# Patient Record
Sex: Female | Born: 1995 | Race: White | Hispanic: No | Smoking: Former smoker
Health system: Southern US, Community
[De-identification: ages and names within clinical notes are randomized; demographics above are authoritative.]

## PROBLEM LIST (undated history)

## (undated) ENCOUNTER — Inpatient Hospital Stay: Payer: Self-pay

## (undated) DIAGNOSIS — F32A Depression, unspecified: Secondary | ICD-10-CM

## (undated) DIAGNOSIS — F419 Anxiety disorder, unspecified: Secondary | ICD-10-CM

## (undated) DIAGNOSIS — N200 Calculus of kidney: Secondary | ICD-10-CM

## (undated) HISTORY — DX: Depression, unspecified: F32.A

## (undated) HISTORY — PX: NO PAST SURGERIES: SHX2092

## (undated) HISTORY — DX: Calculus of kidney: N20.0

## (undated) HISTORY — DX: Anxiety disorder, unspecified: F41.9

---

## 2013-03-29 ENCOUNTER — Emergency Department: Payer: Self-pay | Admitting: Emergency Medicine

## 2013-03-29 LAB — CBC
HCT: 45.8 % (ref 35.0–47.0)
HGB: 15.6 g/dL (ref 12.0–16.0)
MCH: 29.8 pg (ref 26.0–34.0)
MCHC: 34 g/dL (ref 32.0–36.0)
MCV: 88 fL (ref 80–100)
RBC: 5.21 10*6/uL — ABNORMAL HIGH (ref 3.80–5.20)

## 2013-03-29 LAB — COMPREHENSIVE METABOLIC PANEL
Albumin: 4.5 g/dL (ref 3.8–5.6)
Alkaline Phosphatase: 81 U/L — ABNORMAL LOW (ref 82–169)
Anion Gap: 7 (ref 7–16)
BUN: 12 mg/dL (ref 7–18)
Calcium, Total: 9.6 mg/dL (ref 9.0–10.7)
Co2: 26 mmol/L (ref 21–32)
Creatinine: 1.15 mg/dL (ref 0.60–1.30)
Glucose: 106 mg/dL — ABNORMAL HIGH (ref 65–99)
Osmolality: 278 (ref 275–301)
SGPT (ALT): 22 U/L (ref 12–78)
Sodium: 139 mmol/L (ref 136–145)

## 2013-03-29 LAB — URINALYSIS, COMPLETE
Bilirubin,UR: NEGATIVE
Glucose,UR: NEGATIVE mg/dL (ref 0–75)
Nitrite: NEGATIVE
Ph: 6 (ref 4.5–8.0)
Protein: 30
RBC,UR: 38 /HPF (ref 0–5)

## 2014-05-18 IMAGING — CT CT ABD-PELV W/O CM
1 of 2 series · 15 of 32 positions shown, 19 images · non-contrast
Comparison: none

REASON FOR EXAM: (1) flank pain, hematuria; (2) flank pain, hematuria
COMMENTS:

[Series 2: soft tissue · axial · 0.74mm/px · z∈[-1012,-559]mm · 15 of 165 slices shown, 19 images]
[im 7/165  soft-tissue]
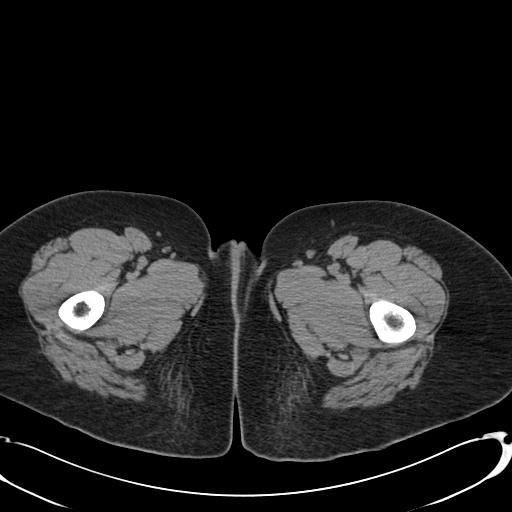
[im 7/165  bone]
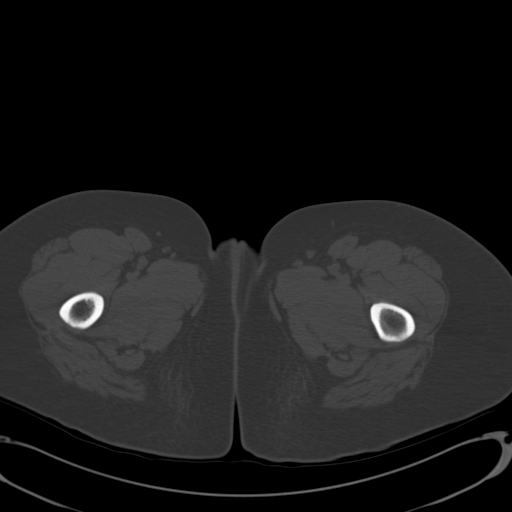
[im 20/165  soft-tissue]
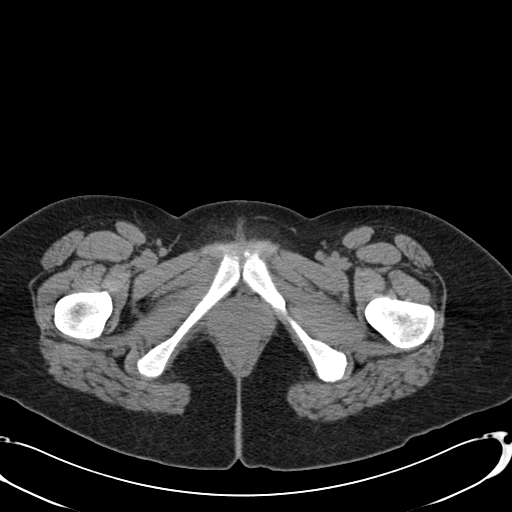
[im 33/165  soft-tissue]
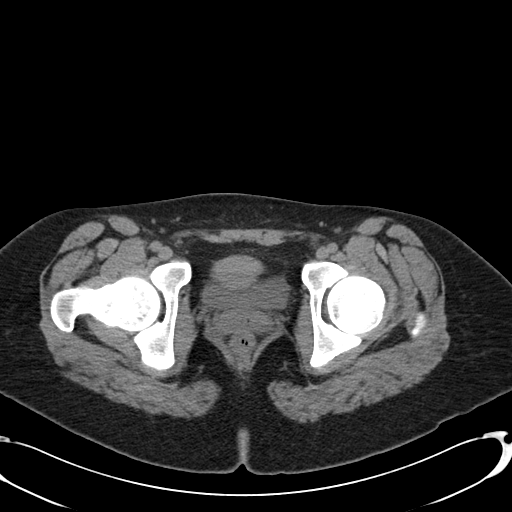
[im 46/165  soft-tissue]
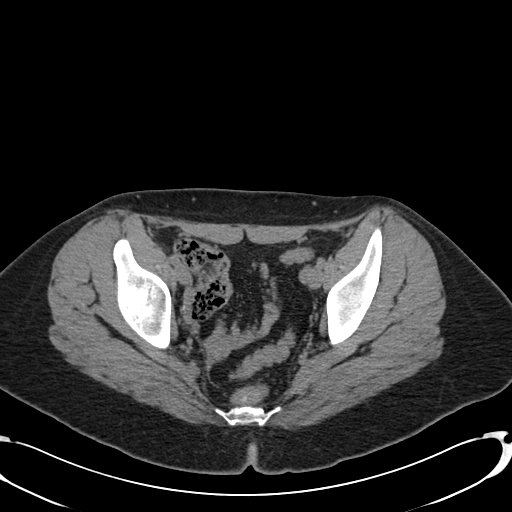
[im 60/165  soft-tissue]
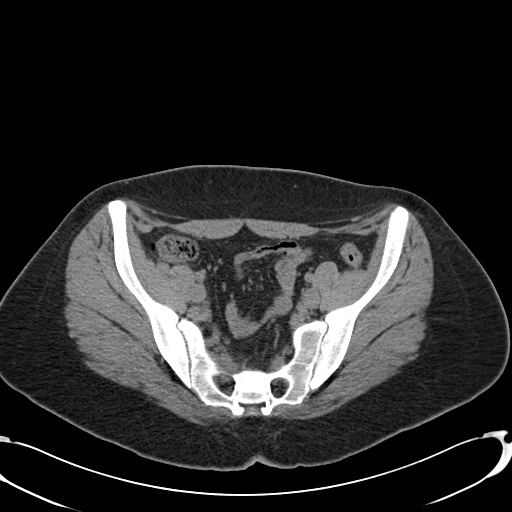
[im 73/165  soft-tissue]
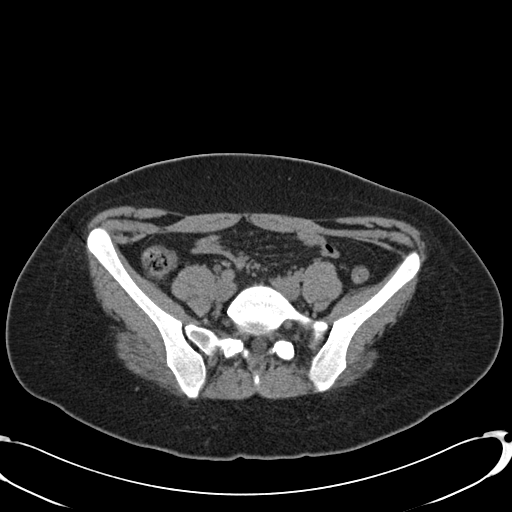
[im 86/165  soft-tissue]
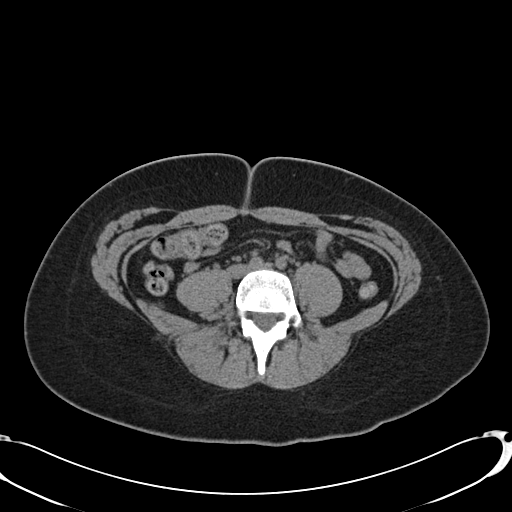
[im 92/165  soft-tissue]
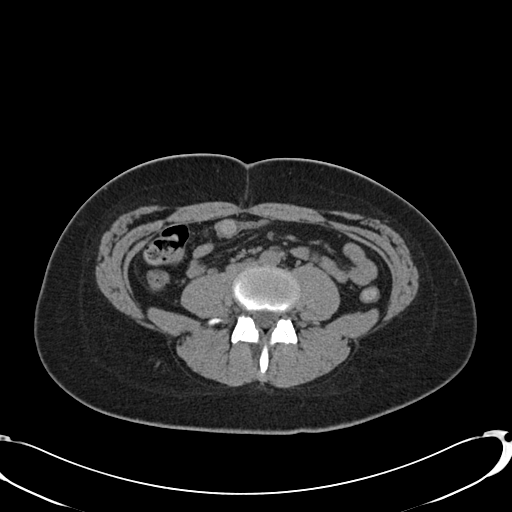
[im 105/165  soft-tissue]
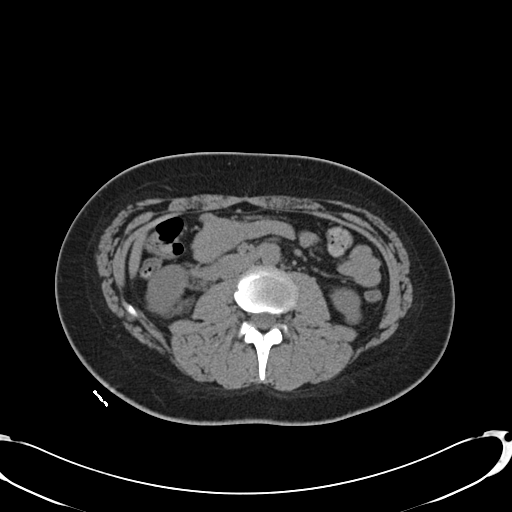
[im 105/165  bone]
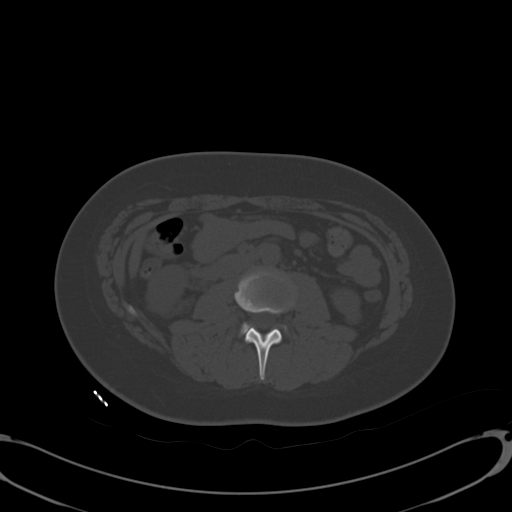
[im 119/165  soft-tissue]
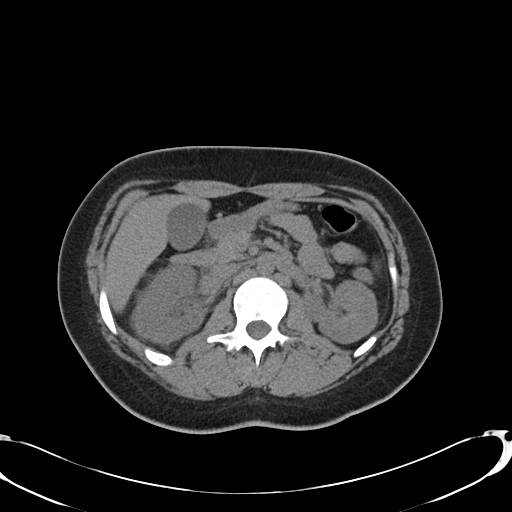
[im 132/165  soft-tissue]
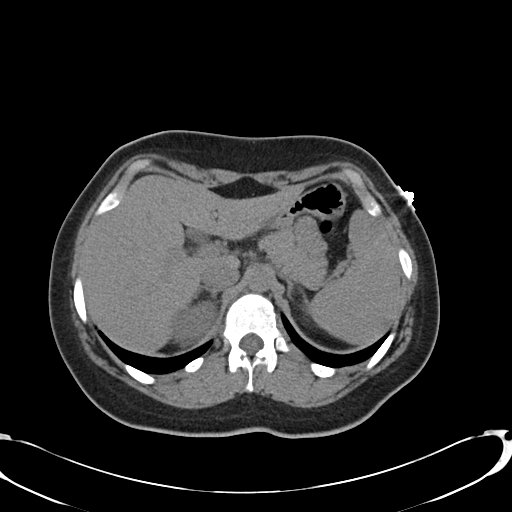
[im 138/165  lung]
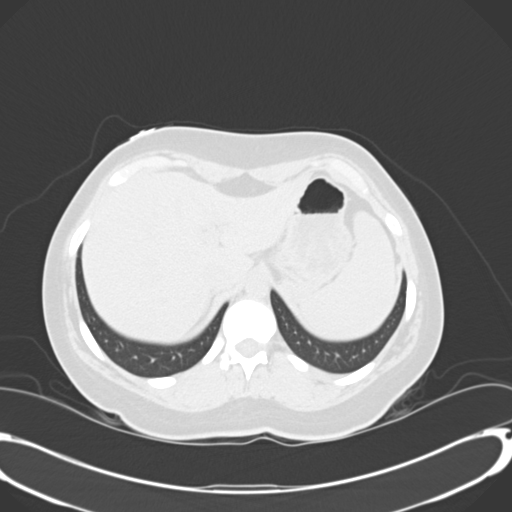
[im 145/165  soft-tissue]
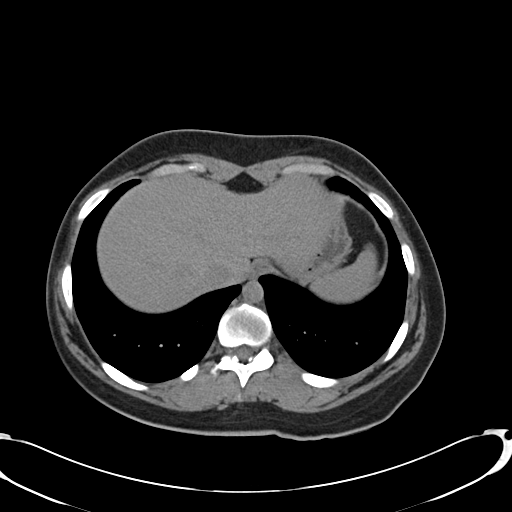
[im 145/165  lung]
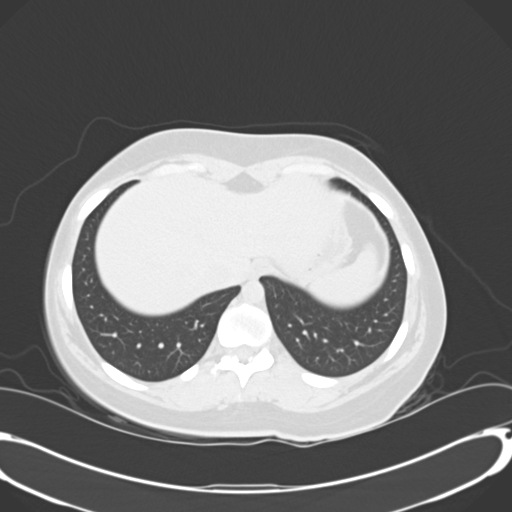
[im 151/165  lung]
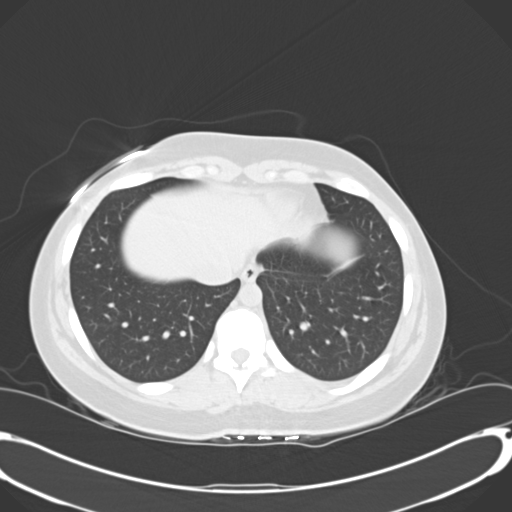
[im 158/165  soft-tissue]
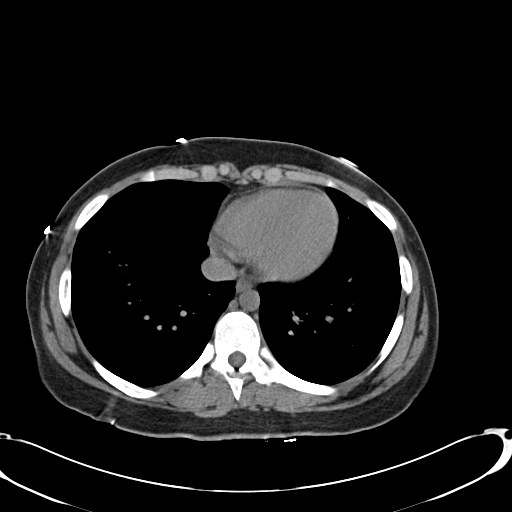
[im 158/165  lung]
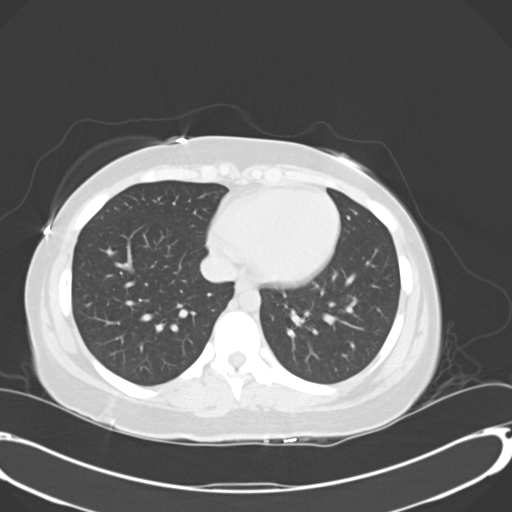

[15 of 32 positions shown; findings below may reference images not displayed]

PROCEDURE:     CT  - CT ABDOMEN AND PELVIS W[DATE]  [DATE]

RESULT:     Axial noncontrast CT scanning was performed through the abdomen
and pelvis with reconstructions at 3 mm intervals and slice thicknesses.
Review of multiplanar reconstructed images was performed separately on the
siemens be a workstation.

There is very mild hydronephrosis and hydroureter on the right which appears
to be related to an approximately 1 mm diameter stone on image 128 near the
ureterovesical junction. A 1 mm diameter calcification on image 131 is felt
to reflect a phlebolith. I do not see evidence of other discrete stones on
the right but there is cloudlike calcification within the medullary portion
of the right upper pole. Mildly increased perinephric fat density is
present. On the left cloudlike density is present in the medullary regions.
There no discrete stones nor is there evidence of obstruction.

The liver, gallbladder, pancreas, spleen, partially distended stomach,
adrenal glands, and abdominal aorta are normal in appearance. The
unopacified loops of small and large bowel exhibit no evidence of ileus nor
of obstruction. A normal calibered gas-filled uninflamed appendix is
demonstrated on images 107 through 113. The uterus and adnexal structures
appear normal for age. There is no significant pelvic fluid collection.
There is no inguinal nor umbilical hernia.

The lung bases are clear. The lumbar vertebral bodies are preserved in
height.
IMPRESSION: 1. There is mild hydronephrosis and hydroureter on the right secondary to an
approximately 1 mm diameter distal right ureteral stone seen on image 128. I
do not see evidence of discrete stones elsewhere on the right or left. There
is no obstruction of the left renal collecting system.
2. There is no acute hepatobiliary, gynecological, or bowel abnormality.

[REDACTED]

## 2017-10-02 NOTE — L&D Delivery Note (Signed)
       Delivery Note   Taylor Galvan is a 22 y.o. G1P0000 at [redacted]w[redacted]d Estimated Date of Delivery: 07/18/18  PRE-OPERATIVE DIAGNOSIS:  1) [redacted]w[redacted]d pregnancy.  2) PIH  POST-OPERATIVE DIAGNOSIS:  1) [redacted]w[redacted]d pregnancy s/p Vaginal, Vacuum (Extractor)  2) asynclitic vertex presentation 3) moderate shoulder dystocia   Delivery Type: Vaginal, Vacuum (Extractor)    Delivery Anesthesia: Epidural   Labor Complications:       ESTIMATED BLOOD LOSS: 75  ml    FINDINGS:   1) female infant, Apgar scores of     at 1 minute and     at 5 minutes and a birthweight of    ounces.    2) Nuchal cord: No  SPECIMENS:   PLACENTA:   Appearance: Intact    Removal: Spontaneous      Disposition:     DISPOSITION:  Infant to transported to Neonatal ICU  NARRATIVE SUMMARY: Labor course:  Taylor Galvan is a G1P0000 at [redacted]w[redacted]d who presented for induction of labor.  She received 2 doses of vaginal Misoprostol, had AROM and pitocin augmentation of labor.  She progressed well in labor.  She received the appropriate anesthesia and proceeded to complete dilation. She evidenced good maternal expulsive effort during the second stage but had slow progress.  A vacuum delivery was indicated.  There was adequate room in the pelvis and the fetal vertex was on the perineum.  The vacuum was applied and traction was performed with each contraction.  Descent of the vertex was noted with each pull. An episiotomy was indicated and after local anes performed in the midline. The vertex delivered from and asynclitic presentation coincident with the R arm posteriorly. Pt was place in McRoberts position and the posterior arm was swept up and delivered allowing delivery of the anterior shoulder.  Baby was placed under the warmer and received resuscitation from the neonatal staff. The placenta delivered without problems and was noted to be complete. A perineal and vaginal examination was performed. Episiotomy or lacerations were  repaired with Vicryl suture using local anesthesia. The patient tolerated this well.  Elonda Husky, M.D. 07/12/2018 2:05 AM

## 2017-11-27 ENCOUNTER — Encounter: Payer: Self-pay | Admitting: Obstetrics and Gynecology

## 2017-11-27 ENCOUNTER — Ambulatory Visit: Payer: 59 | Admitting: Obstetrics and Gynecology

## 2017-11-27 VITALS — BP 137/76 | HR 79 | Wt 158.6 lb

## 2017-11-27 DIAGNOSIS — Z3201 Encounter for pregnancy test, result positive: Secondary | ICD-10-CM

## 2017-11-27 DIAGNOSIS — R110 Nausea: Secondary | ICD-10-CM

## 2017-11-27 DIAGNOSIS — N912 Amenorrhea, unspecified: Secondary | ICD-10-CM

## 2017-11-27 LAB — POCT URINE PREGNANCY: Preg Test, Ur: POSITIVE — AB

## 2017-11-27 NOTE — Progress Notes (Signed)
Pt had a positive pregnancy test.

## 2017-11-27 NOTE — Patient Instructions (Addendum)
First Trimester of Pregnancy The first trimester of pregnancy is from week 1 until the end of week 13 (months 1 through 3). A week after a sperm fertilizes an egg, the egg will implant on the wall of the uterus. This embryo will begin to develop into a baby. Genes from you and your partner will form the baby. The female genes will determine whether the baby will be a boy or a girl. At 6-8 weeks, the eyes and face will be formed, and the heartbeat can be seen on ultrasound. At the end of 12 weeks, all the baby's organs will be formed. Now that you are pregnant, you will want to do everything you can to have a healthy baby. Two of the most important things are to get good prenatal care and to follow your health care provider's instructions. Prenatal care is all the medical care you receive before the baby's birth. This care will help prevent, find, and treat any problems during the pregnancy and childbirth. Body changes during your first trimester Your body goes through many changes during pregnancy. The changes vary from woman to woman.  You may gain or lose a couple of pounds at first.  You may feel sick to your stomach (nauseous) and you may throw up (vomit). If the vomiting is uncontrollable, call your health care provider.  You may tire easily.  You may develop headaches that can be relieved by medicines. All medicines should be approved by your health care provider.  You may urinate more often. Painful urination may mean you have a bladder infection.  You may develop heartburn as a result of your pregnancy.  You may develop constipation because certain hormones are causing the muscles that push stool through your intestines to slow down.  You may develop hemorrhoids or swollen veins (varicose veins).  Your breasts may begin to grow larger and become tender. Your nipples may stick out more, and the tissue that surrounds them (areola) may become darker.  Your gums may bleed and may be  sensitive to brushing and flossing.  Dark spots or blotches (chloasma, mask of pregnancy) may develop on your face. This will likely fade after the baby is born.  Your menstrual periods will stop.  You may have a loss of appetite.  You may develop cravings for certain kinds of food.  You may have changes in your emotions from day to day, such as being excited to be pregnant or being concerned that something may go wrong with the pregnancy and baby.  You may have more vivid and strange dreams.  You may have changes in your hair. These can include thickening of your hair, rapid growth, and changes in texture. Some women also have hair loss during or after pregnancy, or hair that feels dry or thin. Your hair will most likely return to normal after your baby is born.  What to expect at prenatal visits During a routine prenatal visit:  You will be weighed to make sure you and the baby are growing normally.  Your blood pressure will be taken.  Your abdomen will be measured to track your baby's growth.  The fetal heartbeat will be listened to between weeks 10 and 14 of your pregnancy.  Test results from any previous visits will be discussed.  Your health care provider may ask you:  How you are feeling.  If you are feeling the baby move.  If you have had any abnormal symptoms, such as leaking fluid, bleeding, severe headaches,   or abdominal cramping.  If you are using any tobacco products, including cigarettes, chewing tobacco, and electronic cigarettes.  If you have any questions.  Other tests that may be performed during your first trimester include:  Blood tests to find your blood type and to check for the presence of any previous infections. The tests will also be used to check for low iron levels (anemia) and protein on red blood cells (Rh antibodies). Depending on your risk factors, or if you previously had diabetes during pregnancy, you may have tests to check for high blood  sugar that affects pregnant women (gestational diabetes).  Urine tests to check for infections, diabetes, or protein in the urine.  An ultrasound to confirm the proper growth and development of the baby.  Fetal screens for spinal cord problems (spina bifida) and Down syndrome.  HIV (human immunodeficiency virus) testing. Routine prenatal testing includes screening for HIV, unless you choose not to have this test.  You may need other tests to make sure you and the baby are doing well.  Follow these instructions at home: Medicines  Follow your health care provider's instructions regarding medicine use. Specific medicines may be either safe or unsafe to take during pregnancy.  Take a prenatal vitamin that contains at least 600 micrograms (mcg) of folic acid.  If you develop constipation, try taking a stool softener if your health care provider approves. Eating and drinking  Eat a balanced diet that includes fresh fruits and vegetables, whole grains, good sources of protein such as meat, eggs, or tofu, and low-fat dairy. Your health care provider will help you determine the amount of weight gain that is right for you.  Avoid raw meat and uncooked cheese. These carry germs that can cause birth defects in the baby.  Eating four or five small meals rather than three large meals a day may help relieve nausea and vomiting. If you start to feel nauseous, eating a few soda crackers can be helpful. Drinking liquids between meals, instead of during meals, also seems to help ease nausea and vomiting.  Limit foods that are high in fat and processed sugars, such as fried and sweet foods.  To prevent constipation: ? Eat foods that are high in fiber, such as fresh fruits and vegetables, whole grains, and beans. ? Drink enough fluid to keep your urine clear or pale yellow. Activity  Exercise only as directed by your health care provider. Most women can continue their usual exercise routine during  pregnancy. Try to exercise for 30 minutes at least 5 days a week. Exercising will help you: ? Control your weight. ? Stay in shape. ? Be prepared for labor and delivery.  Experiencing pain or cramping in the lower abdomen or lower back is a good sign that you should stop exercising. Check with your health care provider before continuing with normal exercises.  Try to avoid standing for long periods of time. Move your legs often if you must stand in one place for a long time.  Avoid heavy lifting.  Wear low-heeled shoes and practice good posture.  You may continue to have sex unless your health care provider tells you not to. Relieving pain and discomfort  Wear a good support bra to relieve breast tenderness.  Take warm sitz baths to soothe any pain or discomfort caused by hemorrhoids. Use hemorrhoid cream if your health care provider approves.  Rest with your legs elevated if you have leg cramps or low back pain.  If you develop   varicose veins in your legs, wear support hose. Elevate your feet for 15 minutes, 3-4 times a day. Limit salt in your diet. Prenatal care  Schedule your prenatal visits by the twelfth week of pregnancy. They are usually scheduled monthly at first, then more often in the last 2 months before delivery.  Write down your questions. Take them to your prenatal visits.  Keep all your prenatal visits as told by your health care provider. This is important. Safety  Wear your seat belt at all times when driving.  Make a list of emergency phone numbers, including numbers for family, friends, the hospital, and police and fire departments. General instructions  Ask your health care provider for a referral to a local prenatal education class. Begin classes no later than the beginning of month 6 of your pregnancy.  Ask for help if you have counseling or nutritional needs during pregnancy. Your health care provider can offer advice or refer you to specialists for help  with various needs.  Do not use hot tubs, steam rooms, or saunas.  Do not douche or use tampons or scented sanitary pads.  Do not cross your legs for long periods of time.  Avoid cat litter boxes and soil used by cats. These carry germs that can cause birth defects in the baby and possibly loss of the fetus by miscarriage or stillbirth.  Avoid all smoking, herbs, alcohol, and medicines not prescribed by your health care provider. Chemicals in these products affect the formation and growth of the baby.  Do not use any products that contain nicotine or tobacco, such as cigarettes and e-cigarettes. If you need help quitting, ask your health care provider. You may receive counseling support and other resources to help you quit.  Schedule a dentist appointment. At home, brush your teeth with a soft toothbrush and be gentle when you floss. Contact a health care provider if:  You have dizziness.  You have mild pelvic cramps, pelvic pressure, or nagging pain in the abdominal area.  You have persistent nausea, vomiting, or diarrhea.  You have a bad smelling vaginal discharge.  You have pain when you urinate.  You notice increased swelling in your face, hands, legs, or ankles.  You are exposed to fifth disease or chickenpox.  You are exposed to German measles (rubella) and have never had it. Get help right away if:  You have a fever.  You are leaking fluid from your vagina.  You have spotting or bleeding from your vagina.  You have severe abdominal cramping or pain.  You have rapid weight gain or loss.  You vomit blood or material that looks like coffee grounds.  You develop a severe headache.  You have shortness of breath.  You have any kind of trauma, such as from a fall or a car accident. Summary  The first trimester of pregnancy is from week 1 until the end of week 13 (months 1 through 3).  Your body goes through many changes during pregnancy. The changes vary from  woman to woman.  You will have routine prenatal visits. During those visits, your health care provider will examine you, discuss any test results you may have, and talk with you about how you are feeling. This information is not intended to replace advice given to you by your health care provider. Make sure you discuss any questions you have with your health care provider. Document Released: 09/12/2001 Document Revised: 08/30/2016 Document Reviewed: 08/30/2016 Elsevier Interactive Patient Education  2018 [employer]   Inc[employer]  

## 2017-11-27 NOTE — Progress Notes (Signed)
   GYNECOLOGY CLINIC PROGRESS NOTE  Subjective:    Taylor Galvan is a 22 y.o. G0P0000 female who presents for evaluation of amenorrhea. She believes she could be pregnant. Pregnancy is desired. Pregnancy was planned. Sexual Activity: single partner, contraception: none.  Was previously on the pill until she got married in October. Current symptoms also include: fatigue and nausea. Last period was normal.  Patient's last menstrual period was 10/11/2017.    The following portions of the patient's history were reviewed and updated as appropriate:  She  has a past medical history of Kidney stone.   She  has a past surgical history that includes No past surgeries.   Her family history includes Hypertension in her maternal grandmother and mother; Kidney Stones in her maternal grandfather and mother.   She  reports that she has quit smoking. She has quit using smokeless tobacco. She reports that she does not drink alcohol or use drugs.   She has a current medication list which includes the following prescription(s): prenatal gummies/dha & fa.   She has No Known Allergies.    Review of Systems Pertinent items noted in HPI and remainder of comprehensive ROS otherwise negative.     Objective:    BP 137/76   Pulse 79   Wt 158 lb 9.6 oz (71.9 kg)   LMP 10/11/2017  General: alert, no distress and no acute distress    Lab Review Urine HCG: positive    Assessment:    Absence of menstruation.    Pregnancy test positive Nausea of pregnancy  Plan:   - Pregnancy Test: Positive: EDC: 07/18/2018, with EGA 6.5 weeks. Briefly discussed pre-natal care options. Pregnancy, Childbirth and the Newborn book given. Encouraged well-balanced diet, plenty of rest when needed, pre-natal vitamins daily and walking for exercise. Discussed self-help for nausea, avoiding OTC medications until consulting provider or pharmacist, other than Tylenol as needed, minimal caffeine (1-2 cups daily) and avoiding  alcohol. She will schedule her initial OB visit in the next month with her PCP or OB provider. Feel free to call with any questions.    - Given samples for Bonjesta for nausea, can send in prescription if desired.  - Will need ultrasound for dating at 12 weeks as she has normal cycles, no h/o bleeding.  Can also do genetic testing with 1st trimester screen at that time if desired.    Hildred Laserherry, Jaxxon Naeem, MD [facility]

## 2017-12-07 ENCOUNTER — Encounter: Payer: Self-pay | Admitting: Obstetrics and Gynecology

## 2017-12-10 ENCOUNTER — Ambulatory Visit (INDEPENDENT_AMBULATORY_CARE_PROVIDER_SITE_OTHER): Payer: 59 | Admitting: Obstetrics and Gynecology

## 2017-12-10 ENCOUNTER — Other Ambulatory Visit (INDEPENDENT_AMBULATORY_CARE_PROVIDER_SITE_OTHER): Payer: 59

## 2017-12-10 VITALS — BP 145/84 | HR 74 | Ht 68.0 in | Wt 161.9 lb

## 2017-12-10 DIAGNOSIS — Z3A08 8 weeks gestation of pregnancy: Secondary | ICD-10-CM

## 2017-12-10 DIAGNOSIS — Z3401 Encounter for supervision of normal first pregnancy, first trimester: Secondary | ICD-10-CM

## 2017-12-10 DIAGNOSIS — Z3201 Encounter for pregnancy test, result positive: Secondary | ICD-10-CM

## 2017-12-10 NOTE — Progress Notes (Signed)
Taylor Galvan presents for NOB nurse interview visit. Pregnancy confirmation done here at [facility].  G-1 .  P-    . Pregnancy education material explained and given. _No cats in the home. NOB labs ordered.  [name] Kitchen. HIV labs and Drug screen were explained optional and she did not decline. Drug screen ordered PNV encouraged. Genetic screening options discussed. Genetic testing:will discuss with the provider.  Pt may discuss with provider. All questions answered pt will follow up with Dr. Valentino Saxonherry January 01, 2018

## 2017-12-10 NOTE — Patient Instructions (Signed)
First Trimester of Pregnancy The first trimester of pregnancy is from week 1 until the end of week 13 (months 1 through 3). During this time, your baby will begin to develop inside you. At 6-8 weeks, the eyes and face are formed, and the heartbeat can be seen on ultrasound. At the end of 12 weeks, all the baby's organs are formed. Prenatal care is all the medical care you receive before the birth of your baby. Make sure you get good prenatal care and follow all of your doctor's instructions. Follow these instructions at home: Medicines  Take over-the-counter and prescription medicines only as told by your doctor. Some medicines are safe and some medicines are not safe during pregnancy.  Take a prenatal vitamin that contains at least 600 micrograms (mcg) of folic acid.  If you have trouble pooping (constipation), take medicine that will make your stool soft (stool softener) if your doctor approves. Eating and drinking  Eat regular, healthy meals.  Your doctor will tell you the amount of weight gain that is right for you.  Avoid raw meat and uncooked cheese.  If you feel sick to your stomach (nauseous) or throw up (vomit): ? Eat 4 or 5 small meals a day instead of 3 large meals. ? Try eating a few soda crackers. ? Drink liquids between meals instead of during meals.  To prevent constipation: ? Eat foods that are high in fiber, like fresh fruits and vegetables, whole grains, and beans. ? Drink enough fluids to keep your pee (urine) clear or pale yellow. Activity  Exercise only as told by your doctor. Stop exercising if you have cramps or pain in your lower belly (abdomen) or low back.  Do not exercise if it is too hot, too humid, or if you are in a place of great height (high altitude).  Try to avoid standing for long periods of time. Move your legs often if you must stand in one place for a long time.  Avoid heavy lifting.  Wear low-heeled shoes. Sit and stand up straight.  You  can have sex unless your doctor tells you not to. Relieving pain and discomfort  Wear a good support bra if your breasts are sore.  Take warm water baths (sitz baths) to soothe pain or discomfort caused by hemorrhoids. Use hemorrhoid cream if your doctor says it is okay.  Rest with your legs raised if you have leg cramps or low back pain.  If you have puffy, bulging veins (varicose veins) in your legs: ? Wear support hose or compression stockings as told by your doctor. ? Raise (elevate) your feet for 15 minutes, 3-4 times a day. ? Limit salt in your food. Prenatal care  Schedule your prenatal visits by the twelfth week of pregnancy.  Write down your questions. Take them to your prenatal visits.  Keep all your prenatal visits as told by your doctor. This is important. Safety  Wear your seat belt at all times when driving.  Make a list of emergency phone numbers. The list should include numbers for family, friends, the hospital, and police and fire departments. General instructions  Ask your doctor for a referral to a local prenatal class. Begin classes no later than at the start of month 6 of your pregnancy.  Ask for help if you need counseling or if you need help with nutrition. Your doctor can give you advice or tell you where to go for help.  Do not use hot tubs, steam rooms, or   saunas.  Do not douche or use tampons or scented sanitary pads.  Do not cross your legs for long periods of time.  Avoid all herbs and alcohol. Avoid drugs that are not approved by your doctor.  Do not use any tobacco products, including cigarettes, chewing tobacco, and electronic cigarettes. If you need help quitting, ask your doctor. You may get counseling or other support to help you quit.  Avoid cat litter boxes and soil used by cats. These carry germs that can cause birth defects in the baby and can cause a loss of your baby (miscarriage) or stillbirth.  Visit your dentist. At home, brush  your teeth with a soft toothbrush. Be gentle when you floss. Contact a doctor if:  You are dizzy.  You have mild cramps or pressure in your lower belly.  You have a nagging pain in your belly area.  You continue to feel sick to your stomach, you throw up, or you have watery poop (diarrhea).  You have a bad smelling fluid coming from your vagina.  You have pain when you pee (urinate).  You have increased puffiness (swelling) in your face, hands, legs, or ankles. Get help right away if:  You have a fever.  You are leaking fluid from your vagina.  You have spotting or bleeding from your vagina.  You have very bad belly cramping or pain.  You gain or lose weight rapidly.  You throw up blood. It may look like coffee grounds.  You are around people who have German measles, fifth disease, or chickenpox.  You have a very bad headache.  You have shortness of breath.  You have any kind of trauma, such as from a fall or a car accident. Summary  The first trimester of pregnancy is from week 1 until the end of week 13 (months 1 through 3).  To take care of yourself and your unborn baby, you will need to eat healthy meals, take medicines only if your doctor tells you to do so, and do activities that are safe for you and your baby.  Keep all follow-up visits as told by your doctor. This is important as your doctor will have to ensure that your baby is healthy and growing well. This information is not intended to replace advice given to you by your health care provider. Make sure you discuss any questions you have with your health care provider. Document Released: 03/06/2008 Document Revised: 09/26/2016 Document Reviewed: 09/26/2016 Elsevier Interactive Patient Education  2017 [employer]  

## 2017-12-11 LAB — CBC WITH DIFFERENTIAL/PLATELET
BASOS ABS: 0 10*3/uL (ref 0.0–0.2)
Basos: 0 %
EOS (ABSOLUTE): 0.1 10*3/uL (ref 0.0–0.4)
Eos: 1 %
Hematocrit: 41.3 % (ref 34.0–46.6)
Hemoglobin: 14.2 g/dL (ref 11.1–15.9)
Immature Grans (Abs): 0 10*3/uL (ref 0.0–0.1)
Immature Granulocytes: 0 %
Lymphocytes Absolute: 1.6 10*3/uL (ref 0.7–3.1)
Lymphs: 19 %
MCH: 30.3 pg (ref 26.6–33.0)
MCHC: 34.4 g/dL (ref 31.5–35.7)
MCV: 88 fL (ref 79–97)
MONOCYTES: 8 %
Monocytes Absolute: 0.7 10*3/uL (ref 0.1–0.9)
NEUTROS PCT: 72 %
Neutrophils Absolute: 6.2 10*3/uL (ref 1.4–7.0)
PLATELETS: 217 10*3/uL (ref 150–379)
RBC: 4.69 x10E6/uL (ref 3.77–5.28)
RDW: 13.1 % (ref 12.3–15.4)
WBC: 8.6 10*3/uL (ref 3.4–10.8)

## 2017-12-11 LAB — URINALYSIS, ROUTINE W REFLEX MICROSCOPIC
Bilirubin, UA: NEGATIVE
GLUCOSE, UA: NEGATIVE
KETONES UA: NEGATIVE
Leukocytes, UA: NEGATIVE
Nitrite, UA: NEGATIVE
RBC, UA: NEGATIVE
SPEC GRAV UA: 1.028 (ref 1.005–1.030)
UUROB: 0.2 mg/dL (ref 0.2–1.0)
pH, UA: 8 — ABNORMAL HIGH (ref 5.0–7.5)

## 2017-12-11 LAB — ANTIBODY SCREEN: Antibody Screen: NEGATIVE

## 2017-12-11 LAB — ABO AND RH: RH TYPE: NEGATIVE

## 2017-12-11 LAB — HEPATITIS B SURFACE ANTIGEN: HEP B S AG: NEGATIVE

## 2017-12-11 LAB — HIV ANTIBODY (ROUTINE TESTING W REFLEX): HIV Screen 4th Generation wRfx: NONREACTIVE

## 2017-12-11 LAB — VARICELLA ZOSTER ANTIBODY, IGG

## 2017-12-11 LAB — RPR: RPR: NONREACTIVE

## 2017-12-11 LAB — RUBELLA SCREEN: RUBELLA: 2.35 {index} (ref >0.99)

## 2017-12-12 LAB — URINE CULTURE: Organism ID, Bacteria: NO GROWTH

## 2017-12-12 LAB — GC/CHLAMYDIA PROBE AMP
CHLAMYDIA, DNA PROBE: NEGATIVE
Neisseria gonorrhoeae by PCR: NEGATIVE

## 2017-12-13 LAB — MONITOR DRUG PROFILE 14(MW)
AMPHETAMINE SCREEN URINE: NEGATIVE ng/mL
BARBITURATE SCREEN URINE: NEGATIVE ng/mL
BENZODIAZEPINE SCREEN, URINE: NEGATIVE ng/mL
Buprenorphine, Urine: NEGATIVE ng/mL
Cocaine (Metab) Scrn, Ur: NEGATIVE ng/mL
Creatinine(Crt), U: 233.2 mg/dL (ref 20.0–300.0)
FENTANYL, URINE: NEGATIVE pg/mL
Meperidine Screen, Urine: NEGATIVE ng/mL
Methadone Screen, Urine: NEGATIVE ng/mL
OXYCODONE+OXYMORPHONE UR QL SCN: NEGATIVE ng/mL
Opiate Scrn, Ur: NEGATIVE ng/mL
PH UR, DRUG SCRN: 8.3 (ref 4.5–8.9)
PHENCYCLIDINE QUANTITATIVE URINE: NEGATIVE ng/mL
PROPOXYPHENE SCREEN URINE: NEGATIVE ng/mL
SPECIFIC GRAVITY: 1.029
Tramadol Screen, Urine: NEGATIVE ng/mL

## 2017-12-13 LAB — CANNABINOID (GC/MS), URINE: CANNABINOID UR: POSITIVE — AB

## 2018-01-01 ENCOUNTER — Ambulatory Visit (INDEPENDENT_AMBULATORY_CARE_PROVIDER_SITE_OTHER): Payer: 59 | Admitting: Obstetrics and Gynecology

## 2018-01-01 VITALS — BP 137/78 | HR 76 | Wt 163.7 lb

## 2018-01-01 DIAGNOSIS — Z3401 Encounter for supervision of normal first pregnancy, first trimester: Secondary | ICD-10-CM

## 2018-01-01 DIAGNOSIS — O26899 Other specified pregnancy related conditions, unspecified trimester: Secondary | ICD-10-CM

## 2018-01-01 DIAGNOSIS — O219 Vomiting of pregnancy, unspecified: Secondary | ICD-10-CM

## 2018-01-01 DIAGNOSIS — Z6791 Unspecified blood type, Rh negative: Secondary | ICD-10-CM

## 2018-01-01 LAB — POCT URINALYSIS DIPSTICK
BILIRUBIN UA: NEGATIVE
Blood, UA: NEGATIVE
GLUCOSE UA: NEGATIVE
KETONES UA: NEGATIVE
Leukocytes, UA: NEGATIVE
Nitrite, UA: NEGATIVE
SPEC GRAV UA: 1.01 (ref 1.010–1.025)
Urobilinogen, UA: 0.2 E.U./dL
pH, UA: 8.5 — AB (ref 5.0–8.0)

## 2018-01-01 MED ORDER — PROMETHAZINE HCL 25 MG PO TABS: 25.0000 mg | ORAL_TABLET | Freq: Four times a day (QID) | ORAL | 2 refills | Status: DC | PRN

## 2018-01-01 NOTE — Patient Instructions (Signed)
Rh0 [D] Immune Globulin injection What is this medicine? RhO [D] IMMUNE GLOBULIN (i MYOON GLOB yoo lin) is used to treat idiopathic thrombocytopenic purpura (ITP). This medicine is used in RhO negative mothers who are pregnant with a RhO positive child. It is also used after a transfusion of RhO positive blood into a RhO negative person. This medicine may be used for other purposes; ask your health care provider or pharmacist if you have questions. COMMON BRAND NAME(S): BayRho-D, HyperRHO S/D, MICRhoGAM, RhoGAM, Rhophylac, WinRho SDF What should I tell my health care provider before I take this medicine? They need to know if you have any of these conditions: -bleeding disorders -low levels of immunoglobulin A in the body -no spleen -an unusual or allergic reaction to human immune globulin, other medicines, foods, dyes, or preservatives -pregnant or trying to get pregnant -breast-feeding How should I use this medicine? This medicine is for injection into a muscle or into a vein. It is given by a health care professional in a hospital or clinic setting. Talk to your pediatrician regarding the use of this medicine in children. This medicine is not approved for use in children. Overdosage: If you think you have taken too much of this medicine contact a poison control center or emergency room at once. NOTE: This medicine is only for you. Do not share this medicine with others. What if I miss a dose? It is important not to miss your dose. Call your doctor or health care professional if you are unable to keep an appointment. What may interact with this medicine? -live virus vaccines, like measles, mumps, or rubella This list may not describe all possible interactions. Give your health care provider a list of all the medicines, herbs, non-prescription drugs, or dietary supplements you use. Also tell them if you smoke, drink alcohol, or use illegal drugs. Some items may interact with your  medicine. What should I watch for while using this medicine? This medicine is made from human blood. It may be possible to pass an infection in this medicine. Talk to your doctor about the risks and benefits of this medicine. This medicine may interfere with live virus vaccines. Before you get live virus vaccines tell your health care professional if you have received this medicine within the past 3 months. What side effects may I notice from receiving this medicine? Side effects that you should report to your doctor or health care professional as soon as possible: -allergic reactions like skin rash, itching or hives, swelling of the face, lips, or tongue -breathing problems -chest pain or tightness -yellowing of the eyes or skin Side effects that usually do not require medical attention (report to your doctor or health care professional if they continue or are bothersome): -fever -pain and tenderness at site where injected This list may not describe all possible side effects. Call your doctor for medical advice about side effects. You may report side effects to FDA at 1-800-FDA-1088. Where should I keep my medicine? This drug is given in a hospital or clinic and will not be stored at home. NOTE: This sheet is a summary. It may not cover all possible information. If you have questions about this medicine, talk to your doctor, pharmacist, or health care provider.  2018 Elsevier/Gold Standard (2008-05-18 14:06:10) Alpha-Fetoprotein Test Why am I having this test? This test is used to screen for birth defects, such as chromosomal abnormalities, neural tube defects, and body wall defects. It can also be used as a tumor marker  for certain cancers. Alpha-Fetoprotein (AFP) is a protein that is made by the fetal liver. Levels can be detected during pregnancy, starting at [redacted] weeks gestation and peaking at 16-[redacted] weeks gestation. Your health care provider may perform this test if you are pregnant or if a  tumor is suspected. What kind of sample is taken? A blood sample is required for this test. It is usually collected by inserting a needle into a vein. How do I prepare for this test? There is no preparation or fasting required for this test. What are the reference ranges? References ranges are considered healthy ranges established after testing a large group of healthy people. Reference ranges may vary among different people, labs, and hospitals. It is your responsibility to obtain your test results. Ask the lab or department performing the test when and how you will get your results. Reference ranges for AFP are the following:  Adult: Less than 40 ng/mL or less than 40 mcg/L (SI units).  Child younger than 1 year: Less than 30 ng/mL.  Ranges are stratified by weeks of gestation, and they vary among laboratories. What do the results mean? Values above the reference ranges in pregnant women may indicate:  Neural tube defects.  Abdominal wall defects.  Multiple pregnancy.  Congenital abnormalities.  Fetal distress or fetal death.  Values above the reference ranges in nonpregnant women may indicate:  Reproductive cancers.  Liver cancer.  Liver cell death.  Other types of cancer.  Values below the reference ranges in pregnant women may indicate:  Down syndrome.  Fetal death.  Talk with your health care provider to discuss your results, treatment options, and if necessary, the need for more tests. Talk with your health care provider if you have any questions about your results. Talk with your health care provider to discuss your results, treatment options, and if necessary, the need for more tests. Talk with your health care provider if you have any questions about your results. This information is not intended to replace advice given to you by your health care provider. Make sure you discuss any questions you have with your health care provider. Document Released: 10/12/2004  Document Revised: 05/22/2016 Document Reviewed: 02/20/2014 Elsevier Interactive Patient Education  [employer]2018 Elsevier Inc.

## 2018-01-01 NOTE — Progress Notes (Signed)
OBSTETRIC INITIAL PRENATAL VISIT  Subjective:    Taylor Galvan is being seen today for her first obstetrical visit.  This is a planned pregnancy. She is a G1P0000 female at [redacted]w[redacted]d gestation, Estimated Date of Delivery: 07/18/18 with last menstrual period of 10/11/2017, consistent with 8 week sono. Her obstetrical history is significant for none. Relationship with FOB: spouse, living together. Patient does intend to breast feed. Pregnancy history fully reviewed.    OB History  Gravida Para Term Preterm AB Living  1 0 0 0 0 0  SAB TAB Ectopic Multiple Live Births  0 0 0 0 0    # Outcome Date GA Lbr Len/2nd Weight Sex Delivery Anes PTL Lv  1 Current             Gynecologic History:  Last pap smear was: patient has never had a pap smear Denies history of STIs.  Contraception: None.  Discontinued combined OCPs  in October 2018 after getting married.    Past Medical History:  Diagnosis Date  . Kidney stone      Family History  Problem Relation Age of Onset  . Hypertension Mother   . Kidney Stones Mother   . Hypertension Maternal Grandmother   . Kidney Stones Maternal Grandfather      Past Surgical History:  Procedure Laterality Date  . NO PAST SURGERIES       Social History   Socioeconomic History  . Marital status: Single    Spouse name: Not on file  . Number of children: Not on file  . Years of education: Not on file  . Highest education level: Not on file  Occupational History  . Not on file  Social Needs  . Financial resource strain: Not on file  . Food insecurity:    Worry: Not on file    Inability: Not on file  . Transportation needs:    Medical: Not on file    Non-medical: Not on file  Tobacco Use  . Smoking status: Former Games developer  . Smokeless tobacco: Former Engineer, water and Sexual Activity  . Alcohol use: No    Frequency: Never  . Drug use: No  . Sexual activity: Yes    Birth control/protection: None  Lifestyle  . Physical  activity:    Days per week: Not on file    Minutes per session: Not on file  . Stress: Not on file  Relationships  . Social connections:    Talks on phone: Not on file    Gets together: Not on file    Attends religious service: Not on file    Active member of club or organization: Not on file    Attends meetings of clubs or organizations: Not on file    Relationship status: Not on file  . Intimate partner violence:    Fear of current or ex partner: Not on file    Emotionally abused: Not on file    Physically abused: Not on file    Forced sexual activity: Not on file  Other Topics Concern  . Not on file  Social History Narrative  . Not on file     Current Outpatient Medications on File Prior to Visit  Medication Sig Dispense Refill  . Prenatal MV-Min-FA-Omega-3 (PRENATAL GUMMIES/DHA & FA) 0.4-32.5 MG CHEW Chew 2 tablets by mouth daily.     No current facility-administered medications on file prior to visit.      No Known Allergies  Review of Systems General:Not Present- Fever, Weight Loss and Weight Gain. Skin:Not Present- Rash. HEENT:Not Present- Blurred Vision, Headache and Bleeding Gums. Respiratory:Not Present- Difficulty Breathing. Breast:Not Present- Breast Mass. Cardiovascular:Not Present- Chest Pain, Elevated Blood Pressure, Fainting / Blacking Out and Shortness of Breath. Gastrointestinal:Present - Nausea and vomiting daily (morning sickness), not helped with Bonjesta. Not Present- Abdominal Pain, Constipation.  Female Genitourinary:Not Present- Frequency, Painful Urination, Pelvic Pain, Vaginal Bleeding, Vaginal Discharge, Contractions, regular, Fetal Movements Decreased, Urinary Complaints and Vaginal Fluid. Musculoskeletal:Not Present- Back Pain and Leg Cramps. Neurological:Not Present- Dizziness. Psychiatric:Not Present- Depression.     Objective:   Blood pressure 137/78, pulse 76, weight 163 lb 11.2 oz (74.3 kg), last menstrual period  10/11/2017.  Body mass index is 24.89 kg/m.  General Appearance:    Alert, cooperative, no distress, appears stated age  Head:    Normocephalic, without obvious abnormality, atraumatic  Eyes:    PERRL, conjunctiva/corneas clear, EOM's intact, both eyes  Ears:    Normal external ear canals, both ears  Nose:   Nares normal, septum midline, mucosa normal, no drainage or sinus tenderness  Throat:   Lips, mucosa, and tongue normal; teeth and gums normal  Neck:   Supple, symmetrical, trachea midline, no adenopathy; thyroid: no enlargement/tenderness/nodules; no carotid bruit or JVD  Back:     Symmetric, no curvature, ROM normal, no CVA tenderness  Lungs:     Clear to auscultation bilaterally, respirations unlabored  Chest Wall:    No tenderness or deformity   Heart:    Regular rate and rhythm, S1 and S2 normal, no murmur, rub or gallop  Breast Exam:    No tenderness, masses, or nipple abnormality  Abdomen:     Soft, non-tender, bowel sounds active all four quadrants, no masses, no organomegaly.  FH 12.  FHT 162  bpm.  Genitalia:    Pelvic:external genitalia normal, vagina without lesions, discharge, or tenderness, rectovaginal septum  normal. Cervix normal in appearance, no cervical motion tenderness, no adnexal masses or tenderness.  Pregnancy positive findings: uterine enlargement: 12 wk size, nontender.   Rectal:    Normal external sphincter.  No hemorrhoids appreciated. Internal exam not done.   Extremities:   Extremities normal, atraumatic, no cyanosis or edema  Pulses:   2+ and symmetric all extremities  Skin:   Skin color, texture, turgor normal, no rashes or lesions  Lymph nodes:   Cervical, supraclavicular, and axillary nodes normal  Neurologic:   CNII-XII intact, normal strength, sensation and reflexes throughout       Assessment:   Pregnancy at 11 and 5/7 weeks  Rh negative status Nausea and vomiting of pregnancy  Plan:   Initial labs reviewed. Rh negative, will need Rhogam  at [redacted] weeks gestation.  Pap smear performed today.  Prenatal vitamins encouraged. Problem list reviewed and updated. New OB counseling:  The patient has been given an overview regarding routine prenatal care.  Recommendations regarding diet, weight gain, and exercise in pregnancy were given. Prenatal testing, optional genetic testing, and ultrasound use in pregnancy were reviewed.  Currently undecided on genetic testing (considering first trimester screen vs cell-free DNA testing). Given handouts on both.  Benefits of Breast Feeding were discussed. The patient is encouraged to consider nursing her baby post partum. Nausea/vomiting of pregnancy not relieved with Bonjesta.  Will prescribe Phenergan. Advised to take at night initially along with the [facility], then advance to daytime dosing if symptoms persist.  Follow up in 4 weeks.  50% of 30 min visit  spent on counseling and coordination of care.     Hildred Laserherry, Noa Galvao, MD [facility]

## 2018-01-01 NOTE — Progress Notes (Signed)
NOB-pt is having morning sickness no other concerns.

## 2018-01-03 LAB — PAP IG, CT-NG, RFX HPV ASCU
CHLAMYDIA, NUC. ACID AMP: NEGATIVE
GONOCOCCUS BY NUCLEIC ACID AMP: NEGATIVE
PAP SMEAR COMMENT: 0

## 2018-01-04 DIAGNOSIS — Z6791 Unspecified blood type, Rh negative: Secondary | ICD-10-CM | POA: Insufficient documentation

## 2018-01-04 DIAGNOSIS — O26899 Other specified pregnancy related conditions, unspecified trimester: Secondary | ICD-10-CM

## 2018-02-05 ENCOUNTER — Encounter: Payer: Self-pay | Admitting: Obstetrics and Gynecology

## 2018-02-05 ENCOUNTER — Ambulatory Visit (INDEPENDENT_AMBULATORY_CARE_PROVIDER_SITE_OTHER): Payer: 59 | Admitting: Obstetrics and Gynecology

## 2018-02-05 VITALS — BP 120/74 | HR 86 | Wt 165.0 lb

## 2018-02-05 DIAGNOSIS — Z3482 Encounter for supervision of other normal pregnancy, second trimester: Secondary | ICD-10-CM

## 2018-02-05 DIAGNOSIS — O26899 Other specified pregnancy related conditions, unspecified trimester: Secondary | ICD-10-CM

## 2018-02-05 DIAGNOSIS — Z6791 Unspecified blood type, Rh negative: Secondary | ICD-10-CM

## 2018-02-05 LAB — POCT URINALYSIS DIPSTICK
BILIRUBIN UA: NEGATIVE
Blood, UA: NEGATIVE
GLUCOSE UA: NEGATIVE
Ketones, UA: NEGATIVE
LEUKOCYTES UA: NEGATIVE
Nitrite, UA: NEGATIVE
ODOR: NEGATIVE
Spec Grav, UA: 1.01 (ref 1.010–1.025)
Urobilinogen, UA: 0.2 E.U./dL
pH, UA: 8 (ref 5.0–8.0)

## 2018-02-05 NOTE — Progress Notes (Signed)
ROB: She continues to experience occasional nausea and vomiting.  We have discussed multiple strategies including B6, Sea bands, ginger snap cookies.  Discussed genetic testing again.  She desires quad screen.  Still considering MaterniT 21.  Ultrasound next visit fetal anatomic survey.

## 2018-02-05 NOTE — Progress Notes (Signed)
ROB- no complaints. Unsure of genetic testing. Will check with insurance.

## 2018-02-07 LAB — AFP TETRA
DIA MOM VALUE: 2.78
DIA Value (EIA): 449.53 pg/mL
DSR (BY AGE) 1 IN: 1056
DSR (Second Trimester) 1 IN: 1420
GESTATIONAL AGE AFP: 16.7 wk
MATERNAL AGE AT EDD: 24.4 a
MSAFP Mom: 1.62
MSAFP: 54.2 ng/mL
MSHCG MOM: 1.62
MSHCG: 55367 m[IU]/mL
Osb Risk: 1995
T18 (By Age): 1:4113 {titer}
Test Results:: NEGATIVE
UE3 MOM: 1.02
UE3 VALUE: 0.95 ng/mL
Weight: 165 [lb_av]

## 2018-02-20 ENCOUNTER — Other Ambulatory Visit: Payer: Self-pay | Admitting: Obstetrics and Gynecology

## 2018-02-20 DIAGNOSIS — Z3689 Encounter for other specified antenatal screening: Secondary | ICD-10-CM

## 2018-03-04 ENCOUNTER — Encounter: Payer: Self-pay | Admitting: Obstetrics and Gynecology

## 2018-03-04 ENCOUNTER — Ambulatory Visit (INDEPENDENT_AMBULATORY_CARE_PROVIDER_SITE_OTHER): Payer: 59 | Admitting: Obstetrics and Gynecology

## 2018-03-04 ENCOUNTER — Ambulatory Visit (INDEPENDENT_AMBULATORY_CARE_PROVIDER_SITE_OTHER): Payer: 59

## 2018-03-04 VITALS — BP 126/75 | HR 73 | Wt 168.6 lb

## 2018-03-04 DIAGNOSIS — Z3689 Encounter for other specified antenatal screening: Secondary | ICD-10-CM

## 2018-03-04 DIAGNOSIS — O219 Vomiting of pregnancy, unspecified: Secondary | ICD-10-CM

## 2018-03-04 DIAGNOSIS — Z3402 Encounter for supervision of normal first pregnancy, second trimester: Secondary | ICD-10-CM

## 2018-03-04 LAB — POCT URINALYSIS DIPSTICK
Bilirubin, UA: NEGATIVE
Blood, UA: NEGATIVE
Glucose, UA: NEGATIVE
Ketones, UA: NEGATIVE
Leukocytes, UA: NEGATIVE
NITRITE UA: NEGATIVE
PH UA: 8 (ref 5.0–8.0)
PROTEIN UA: POSITIVE — AB
Spec Grav, UA: 1.01 (ref 1.010–1.025)
UROBILINOGEN UA: 0.2 U/dL

## 2018-03-04 NOTE — Progress Notes (Signed)
ROB pt is doing well no concerns.  

## 2018-03-04 NOTE — Progress Notes (Signed)
ROB: Patient overall doing well. Notes that her nausea/vomiting is improving. Desires to change PNV, given samples of.  Normal anatomy scan. RTC in 4 weeks.

## 2018-03-04 NOTE — Patient Instructions (Signed)
Second Trimester of Pregnancy The second trimester is from week 13 through week 28, month 4 through 6. This is often the time in pregnancy that you feel your best. Often times, morning sickness has lessened or quit. You may have more energy, and you may get hungry more often. Your unborn baby (fetus) is growing rapidly. At the end of the sixth month, he or she is about 9 inches long and weighs about 1 pounds. You will likely feel the baby move (quickening) between 18 and 20 weeks of pregnancy. Follow these instructions at home:  Avoid all smoking, herbs, and alcohol. Avoid drugs not approved by your doctor.  Do not use any tobacco products, including cigarettes, chewing tobacco, and electronic cigarettes. If you need help quitting, ask your doctor. You may get counseling or other support to help you quit.  Only take medicine as told by your doctor. Some medicines are safe and some are not during pregnancy.  Exercise only as told by your doctor. Stop exercising if you start having cramps.  Eat regular, healthy meals.  Wear a good support bra if your breasts are tender.  Do not use hot tubs, steam rooms, or saunas.  Wear your seat belt when driving.  Avoid raw meat, uncooked cheese, and liter boxes and soil used by cats.  Take your prenatal vitamins.  Take 1500-2000 milligrams of calcium daily starting at the 20th week of pregnancy until you deliver your baby.  Try taking medicine that helps you poop (stool softener) as needed, and if your doctor approves. Eat more fiber by eating fresh fruit, vegetables, and whole grains. Drink enough fluids to keep your pee (urine) clear or pale yellow.  Take warm water baths (sitz baths) to soothe pain or discomfort caused by hemorrhoids. Use hemorrhoid cream if your doctor approves.  If you have puffy, bulging veins (varicose veins), wear support hose. Raise (elevate) your feet for 15 minutes, 3-4 times a day. Limit salt in your diet.  Avoid heavy  lifting, wear low heals, and sit up straight.  Rest with your legs raised if you have leg cramps or low back pain.  Visit your dentist if you have not gone during your pregnancy. Use a soft toothbrush to brush your teeth. Be gentle when you floss.  You can have sex (intercourse) unless your doctor tells you not to.  Go to your doctor visits. Get help if:  You feel dizzy.  You have mild cramps or pressure in your lower belly (abdomen).  You have a nagging pain in your belly area.  You continue to feel sick to your stomach (nauseous), throw up (vomit), or have watery poop (diarrhea).  You have bad smelling fluid coming from your vagina.  You have pain with peeing (urination). Get help right away if:  You have a fever.  You are leaking fluid from your vagina.  You have spotting or bleeding from your vagina.  You have severe belly cramping or pain.  You lose or gain weight rapidly.  You have trouble catching your breath and have chest pain.  You notice sudden or extreme puffiness (swelling) of your face, hands, ankles, feet, or legs.  You have not felt the baby move in over an hour.  You have severe headaches that do not go away with medicine.  You have vision changes. This information is not intended to replace advice given to you by your health care provider. Make sure you discuss any questions you have with your health care   provider. Document Released: 12/13/2009 Document Revised: 02/24/2016 Document Reviewed: 11/19/2012 Elsevier Interactive Patient Education  2017 Elsevier Inc.  

## 2018-03-14 ENCOUNTER — Telehealth: Payer: Self-pay | Admitting: Obstetrics and Gynecology

## 2018-03-14 MED ORDER — CITRANATAL BLOOM 90-1 MG PO TABS: 90.0000 mg | ORAL_TABLET | Freq: Every day | ORAL | 11 refills | Status: DC

## 2018-03-14 NOTE — Telephone Encounter (Signed)
Pt aware pnv erx. 

## 2018-03-14 NOTE — Telephone Encounter (Signed)
The patient called and stated that she would like to have a prescription written for the prenatal vitamins she was given. (Citrinatal Bloom) No other information was disclosed. Please advise.

## 2018-04-01 ENCOUNTER — Ambulatory Visit (INDEPENDENT_AMBULATORY_CARE_PROVIDER_SITE_OTHER): Payer: 59 | Admitting: Obstetrics and Gynecology

## 2018-04-01 ENCOUNTER — Encounter: Payer: Self-pay | Admitting: Obstetrics and Gynecology

## 2018-04-01 VITALS — BP 120/72 | HR 92 | Wt 173.9 lb

## 2018-04-01 DIAGNOSIS — Z3402 Encounter for supervision of normal first pregnancy, second trimester: Secondary | ICD-10-CM

## 2018-04-01 LAB — POCT URINALYSIS DIPSTICK
BILIRUBIN UA: NEGATIVE
Blood, UA: NEGATIVE
GLUCOSE UA: NEGATIVE
Ketones, UA: NEGATIVE
LEUKOCYTES UA: NEGATIVE
Nitrite, UA: NEGATIVE
ODOR: NEGATIVE
Protein, UA: POSITIVE — AB
Spec Grav, UA: 1.01 (ref 1.010–1.025)
Urobilinogen, UA: 0.2 E.U./dL
pH, UA: 7.5 (ref 5.0–8.0)

## 2018-04-01 NOTE — Progress Notes (Signed)
ROB- Pos for hemorrhoids.

## 2018-04-01 NOTE — Patient Instructions (Signed)

## 2018-04-01 NOTE — Progress Notes (Signed)
ROB: Patient occasionally has hemorrhoids but she says they are currently asymptomatic.  Previously had an anal fissure so has used Preparation H MiraLAX etc. before.  Reports active fetal movement daily.

## 2018-04-12 ENCOUNTER — Encounter: Payer: Self-pay | Admitting: Certified Nurse Midwife

## 2018-04-12 ENCOUNTER — Observation Stay
Admission: EM | Admit: 2018-04-12 | Discharge: 2018-04-13 | Disposition: A | Discharge status: Home / Self Care | Payer: 59 | Attending: Obstetrics and Gynecology | Admitting: Obstetrics and Gynecology

## 2018-04-12 DIAGNOSIS — N200 Calculus of kidney: Secondary | ICD-10-CM | POA: Diagnosis not present

## 2018-04-12 DIAGNOSIS — O219 Vomiting of pregnancy, unspecified: Secondary | ICD-10-CM | POA: Diagnosis not present

## 2018-04-12 DIAGNOSIS — O26892 Other specified pregnancy related conditions, second trimester: Secondary | ICD-10-CM | POA: Diagnosis not present

## 2018-04-12 DIAGNOSIS — R1110 Vomiting, unspecified: Secondary | ICD-10-CM | POA: Diagnosis not present

## 2018-04-12 DIAGNOSIS — R109 Unspecified abdominal pain: Secondary | ICD-10-CM | POA: Insufficient documentation

## 2018-04-12 DIAGNOSIS — Z3A25 25 weeks gestation of pregnancy: Secondary | ICD-10-CM

## 2018-04-12 DIAGNOSIS — Z3A26 26 weeks gestation of pregnancy: Secondary | ICD-10-CM | POA: Insufficient documentation

## 2018-04-12 DIAGNOSIS — Z3490 Encounter for supervision of normal pregnancy, unspecified, unspecified trimester: Secondary | ICD-10-CM

## 2018-04-12 DIAGNOSIS — O9989 Other specified diseases and conditions complicating pregnancy, childbirth and the puerperium: Secondary | ICD-10-CM | POA: Diagnosis not present

## 2018-04-12 DIAGNOSIS — Z87442 Personal history of urinary calculi: Secondary | ICD-10-CM | POA: Diagnosis not present

## 2018-04-12 LAB — URINALYSIS, COMPLETE (UACMP) WITH MICROSCOPIC
BILIRUBIN URINE: NEGATIVE
GLUCOSE, UA: NEGATIVE mg/dL
Hgb urine dipstick: NEGATIVE
KETONES UR: NEGATIVE mg/dL
Nitrite: NEGATIVE
PH: 6 (ref 5.0–8.0)
PROTEIN: NEGATIVE mg/dL
Specific Gravity, Urine: 1.023 (ref 1.005–1.030)

## 2018-04-12 MED ORDER — LACTATED RINGERS IV SOLN
INTRAVENOUS | Status: DC
  Administered 2018-04-12 – 2018-04-13 (×2): via INTRAVENOUS

## 2018-04-12 MED ORDER — LACTATED RINGERS IV BOLUS
500.0000 mL | Freq: Once | INTRAVENOUS | Status: AC
  Administered 2018-04-12: 500 mL via INTRAVENOUS

## 2018-04-12 MED ORDER — BUTORPHANOL TARTRATE 2 MG/ML IJ SOLN
1.0000 mg | Freq: Once | INTRAMUSCULAR | Status: AC
  Administered 2018-04-12: 1 mg via INTRAVENOUS
  Filled 2018-04-12: qty 1

## 2018-04-12 MED ORDER — ONDANSETRON HCL 4 MG/2ML IJ SOLN
4.0000 mg | Freq: Once | INTRAMUSCULAR | Status: AC
  Administered 2018-04-12: 4 mg via INTRAVENOUS
  Filled 2018-04-12: qty 2

## 2018-04-12 NOTE — OB Triage Note (Signed)
Pt states she is here for left sided back pain. Pt states it feel similar to the kidney stone pain she has had in the past. Pt states she has been vomiting today. Pain is 10/10. Pt denies ctxs, LOF, or vaginal bleeding. +FM.

## 2018-04-13 DIAGNOSIS — O219 Vomiting of pregnancy, unspecified: Secondary | ICD-10-CM

## 2018-04-13 DIAGNOSIS — Z3490 Encounter for supervision of normal pregnancy, unspecified, unspecified trimester: Secondary | ICD-10-CM

## 2018-04-13 DIAGNOSIS — O26892 Other specified pregnancy related conditions, second trimester: Secondary | ICD-10-CM | POA: Diagnosis not present

## 2018-04-13 NOTE — Discharge Instructions (Signed)
Please keep followup appointments.  If you have any questions or concerns please call the oncall provider for [facility].  If you have questions you may also call the nurse's desk at [phone].  If you have urgent concerns please go to the nearest emergency department for evaluation.

## 2018-04-13 NOTE — Discharge Summary (Addendum)
     L&D OB Triage Note.  SUBJECTIVE Taylor Galvan is a 22 y.o. 821P0000 female at 58106w2d, EDD Estimated Date of Delivery: 07/18/18 who presented to triage with complaints of left flank pain and persistent vomiting. Pt reports a remote h/o kidney stones that "felt just like this."   OB History  Gravida Para Term Preterm AB Living  1 0 0 0 0 0  SAB TAB Ectopic Multiple Live Births  0 0 0 0 0    # Outcome Date GA Lbr Len/2nd Weight Sex Delivery Anes PTL Lv  1 Current             Medications Prior to Admission  Medication Sig Dispense Refill Last Dose  . Prenatal-DSS-FeCb-FeGl-FA (CITRANATAL BLOOM) 90-1 MG TABS Take 90 mg by mouth daily. 30 tablet 11 Taking  . promethazine (PHENERGAN) 25 MG tablet Take 1 tablet (25 mg total) by mouth every 6 (six) hours as needed for nausea or vomiting. (Patient not taking: Reported on 04/12/2018) 30 tablet 2 Completed Course at Unknown time     OBJECTIVE  Nursing Evaluation:   BP 118/71   Pulse 69   Temp 98 F (36.7 C) (Oral)   Resp 14   Ht 5\' 8"  (1.727 m)   Wt 173 lb (78.5 kg)   LMP 10/11/2017   BMI 26.30 kg/m    Findings:   Hematuria present. - L flank pain.  Pt not in labor.  NST was performed and has been reviewed by me.  - X2 (7/12 and 7/13) Patient was personally seen and examined by me.  NST INTERPRETATION: Appropriate for gestational age.    Mode: External Baseline Rate (A): 135 bpm Variability: Moderate Accelerations: 15 x 15 Decelerations: None     Contraction Frequency (min): 0  ASSESSMENT Impression:  1.  Pregnancy:  G1P0000 at 21106w2d , EDD Estimated Date of Delivery: 07/18/18 2.  NST:  Category I  3.  Likely right-sided gravel or small stone.  Resolved with IV hydration. - Pt slept through the night and woke without pain or  Nausea/Vomiting.  PLAN 1. Reassurance given 2. Discharge home with standard labor precautions given to return to L&D or call the office for problems. 3. Continue routine prenatal care.

## 2018-04-18 ENCOUNTER — Emergency Department: Payer: 59

## 2018-04-18 ENCOUNTER — Other Ambulatory Visit: Payer: Self-pay

## 2018-04-18 ENCOUNTER — Encounter: Payer: Self-pay | Admitting: Emergency Medicine

## 2018-04-18 ENCOUNTER — Observation Stay
Admission: EM | Admit: 2018-04-18 | Discharge: 2018-04-18 | Disposition: A | Discharge status: Home / Self Care | Payer: 59 | Attending: Obstetrics and Gynecology | Admitting: Obstetrics and Gynecology

## 2018-04-18 DIAGNOSIS — Z87891 Personal history of nicotine dependence: Secondary | ICD-10-CM | POA: Diagnosis not present

## 2018-04-18 DIAGNOSIS — N200 Calculus of kidney: Secondary | ICD-10-CM

## 2018-04-18 DIAGNOSIS — R109 Unspecified abdominal pain: Secondary | ICD-10-CM

## 2018-04-18 DIAGNOSIS — O26833 Pregnancy related renal disease, third trimester: Secondary | ICD-10-CM

## 2018-04-18 DIAGNOSIS — Z79899 Other long term (current) drug therapy: Secondary | ICD-10-CM | POA: Insufficient documentation

## 2018-04-18 DIAGNOSIS — R112 Nausea with vomiting, unspecified: Secondary | ICD-10-CM

## 2018-04-18 DIAGNOSIS — Z87442 Personal history of urinary calculi: Secondary | ICD-10-CM | POA: Insufficient documentation

## 2018-04-18 DIAGNOSIS — N132 Hydronephrosis with renal and ureteral calculous obstruction: Secondary | ICD-10-CM | POA: Diagnosis not present

## 2018-04-18 DIAGNOSIS — N1330 Unspecified hydronephrosis: Secondary | ICD-10-CM

## 2018-04-18 DIAGNOSIS — Z3A27 27 weeks gestation of pregnancy: Secondary | ICD-10-CM | POA: Diagnosis not present

## 2018-04-18 DIAGNOSIS — O26832 Pregnancy related renal disease, second trimester: Secondary | ICD-10-CM | POA: Diagnosis not present

## 2018-04-18 HISTORY — DX: Calculus of kidney: N20.0

## 2018-04-18 LAB — URINALYSIS, COMPLETE (UACMP) WITH MICROSCOPIC
Bilirubin Urine: NEGATIVE
GLUCOSE, UA: NEGATIVE mg/dL
HGB URINE DIPSTICK: NEGATIVE
Ketones, ur: NEGATIVE mg/dL
NITRITE: NEGATIVE
PH: 7 (ref 5.0–8.0)
Protein, ur: NEGATIVE mg/dL
SPECIFIC GRAVITY, URINE: 1.014 (ref 1.005–1.030)

## 2018-04-18 LAB — CBC
HCT: 35.5 % (ref 35.0–47.0)
Hemoglobin: 12.7 g/dL (ref 12.0–16.0)
MCH: 31.9 pg (ref 26.0–34.0)
MCHC: 35.9 g/dL (ref 32.0–36.0)
MCV: 88.9 fL (ref 80.0–100.0)
PLATELETS: 159 10*3/uL (ref 150–440)
RBC: 3.99 MIL/uL (ref 3.80–5.20)
RDW: 13.4 % (ref 11.5–14.5)
WBC: 8.8 10*3/uL (ref 3.6–11.0)

## 2018-04-18 LAB — COMPREHENSIVE METABOLIC PANEL
ALK PHOS: 45 U/L (ref 38–126)
ALT: 11 U/L (ref 0–44)
AST: 17 U/L (ref 15–41)
Albumin: 3.2 g/dL — ABNORMAL LOW (ref 3.5–5.0)
Anion gap: 7 (ref 5–15)
BILIRUBIN TOTAL: 0.6 mg/dL (ref 0.3–1.2)
BUN: 8 mg/dL (ref 6–20)
CALCIUM: 8.2 mg/dL — AB (ref 8.9–10.3)
CO2: 22 mmol/L (ref 22–32)
CREATININE: 0.67 mg/dL (ref 0.44–1.00)
Chloride: 106 mmol/L (ref 98–111)
GFR calc non Af Amer: >60 mL/min (ref >60)
Glucose, Bld: 97 mg/dL (ref 70–99)
Potassium: 3.6 mmol/L (ref 3.5–5.1)
SODIUM: 135 mmol/L (ref 135–145)
TOTAL PROTEIN: 6.2 g/dL — AB (ref 6.5–8.1)

## 2018-04-18 MED ORDER — NITROFURANTOIN MONOHYD MACRO 100 MG PO CAPS
100.0000 mg | ORAL_CAPSULE | Freq: Once | ORAL | Status: AC
  Administered 2018-04-18: 100 mg via ORAL
  Filled 2018-04-18: qty 1

## 2018-04-18 MED ORDER — ACETAMINOPHEN 500 MG PO TABS
1000.0000 mg | ORAL_TABLET | Freq: Once | ORAL | Status: DC
  Filled 2018-04-18: qty 2

## 2018-04-18 MED ORDER — FENTANYL CITRATE (PF) 100 MCG/2ML IJ SOLN
75.0000 ug | Freq: Once | INTRAMUSCULAR | Status: AC
  Administered 2018-04-18: 75 ug via INTRAVENOUS
  Filled 2018-04-18: qty 2

## 2018-04-18 MED ORDER — ONDANSETRON HCL 4 MG/2ML IJ SOLN
4.0000 mg | Freq: Once | INTRAMUSCULAR | Status: AC
  Administered 2018-04-18: 4 mg via INTRAVENOUS
  Filled 2018-04-18: qty 2

## 2018-04-18 MED ORDER — LACTATED RINGERS IV SOLN
INTRAVENOUS | Status: DC
  Administered 2018-04-18: 999 mL via INTRAVENOUS

## 2018-04-18 MED ORDER — OXYCODONE-ACETAMINOPHEN 5-325 MG PO TABS: 1.0000 | ORAL_TABLET | ORAL | 0 refills | Status: DC | PRN

## 2018-04-18 MED ORDER — BUTORPHANOL TARTRATE 2 MG/ML IJ SOLN: 1.0000 mg | INTRAMUSCULAR | Status: DC | PRN

## 2018-04-18 MED ORDER — ONDANSETRON HCL 4 MG/2ML IJ SOLN: 4.0000 mg | Freq: Four times a day (QID) | INTRAMUSCULAR | Status: DC | PRN

## 2018-04-18 MED ORDER — HYDROMORPHONE HCL 1 MG/ML IJ SOLN
0.5000 mg | Freq: Once | INTRAMUSCULAR | Status: AC
  Administered 2018-04-18: 0.5 mg via INTRAVENOUS
  Filled 2018-04-18: qty 1

## 2018-04-18 MED ORDER — SODIUM CHLORIDE 0.9 % IV BOLUS
1000.0000 mL | Freq: Once | INTRAVENOUS | Status: AC
  Administered 2018-04-18: 1000 mL via INTRAVENOUS

## 2018-04-18 NOTE — ED Provider Notes (Signed)
[facility] Emergency Department Provider Note  ____________________________________________  Time seen: Approximately 8:30 AM  I have reviewed the triage vital signs and the nursing notes.   HISTORY  Chief Complaint Flank Pain    HPI Taylor Galvan is a 22 y.o. female 1P0 [redacted] weeks pregnant with a history of renal colic presenting for left flank pain.  The patient presented 4 days ago to the emergency department with left flank pain, and was found to have hematuria and a presentation consistent with renal colic.  She was given intravenous fluids and symptomatic treatment and was able to be discharged home.  At 5 AM this morning, the patient woke up with a sharp left flank pain and multiple episodes of vomiting.  She attempted to take Tylenol but was unable to keep it down.  She has not had any change in vaginal discharge, vaginal bleeding.  She continues to have normal fetal movement.  She denies any fevers or chills, dysuria, urinary frequency or hematuria.  Past Medical History:  Diagnosis Date  . Kidney stone     Patient Active Problem List   Diagnosis Date Noted  . Pregnancy 04/13/2018  . Rh negative state in antepartum period 01/04/2018    Past Surgical History:  Procedure Laterality Date  . NO PAST SURGERIES      Current Outpatient Rx  . Order #: [id number] Class: Normal  . Order #: [id number] Class: Normal    Allergies Patient has no known allergies.  Family History  Problem Relation Age of Onset  . Hypertension Mother   . Kidney Stones Mother   . Hypertension Maternal Grandmother   . Kidney Stones Maternal Grandfather     Social History Social History   Tobacco Use  . Smoking status: Former Games developer  . Smokeless tobacco: Former Engineer, water Use Topics  . Alcohol use: No    Frequency: Never  . Drug use: No    Review of Systems Constitutional: No fever/chills.  Lightheadedness or syncope. Eyes: No visual changes. ENT: No  sore throat. No congestion or rhinorrhea. Cardiovascular: Denies chest pain. Denies palpitations. Respiratory: Denies shortness of breath.  No cough. Gastrointestinal: No abdominal pain.  Positive left flank pain.  Positive nausea, positive vomiting.  No diarrhea.  No constipation. Genitourinary: Negative for dysuria.  No urinary frequency.  No change in vaginal discharge.  No vaginal bleeding. Musculoskeletal: Negative for back pain except her flank pain on the left. Skin: Negative for rash. Neurological: Negative for headaches. No focal numbness, tingling or weakness.     ____________________________________________   PHYSICAL EXAM:  VITAL SIGNS: ED Triage Vitals  Enc Vitals Group     BP 04/18/18 0733 128/80     Pulse Rate 04/18/18 0733 90     Resp --      Temp 04/18/18 0733 97.8 F (36.6 C)     Temp Source 04/18/18 0733 Oral     SpO2 04/18/18 0733 98 %     Weight 04/18/18 0728 170 lb (77.1 kg)     Height 04/18/18 0728 5\' 8"  (1.727 m)     Head Circumference --      Peak Flow --      Pain Score 04/18/18 0728 10     Pain Loc --      Pain Edu? --      Excl. in GC? --     Constitutional: Alert and oriented.  Answers questions appropriately.  Uncomfortable appearing but nontoxic. Eyes: Conjunctivae are normal.  EOMI. No  scleral icterus. Head: Atraumatic. Nose: No congestion/rhinnorhea. Mouth/Throat: Mucous membranes are dry.  Neck: No stridor.  Supple.  No meningismus. Cardiovascular: Normal rate, regular rhythm. No murmurs, rubs or gallops.  Respiratory: Normal respiratory effort.  No accessory muscle use or retractions. Lungs CTAB.  No wheezes, rales or ronchi. Gastrointestinal: Soft, nontender and nondistended.  The patient has a gravid uterus that is palpable to approximately 5 to 7 cm above the umbilicus.  No guarding or rebound.  No peritoneal signs.  No reproducible right or left CVA tenderness. Musculoskeletal: No LE edema.  Neurologic:  A&Ox3.  Speech is clear.   Face and smile are symmetric.  EOMI.  Moves all extremities well. Skin:  Skin is warm, dry and intact. No rash noted. Psychiatric: Mood and affect are normal. Speech and behavior are normal.  Normal judgement.  ____________________________________________   LABS (all labs ordered are listed, but only abnormal results are displayed)  Labs Reviewed  URINALYSIS, COMPLETE (UACMP) WITH MICROSCOPIC - Abnormal; Notable for the following components:      Result Value   Color, Urine YELLOW (*)    APPearance CLOUDY (*)    Leukocytes, UA MODERATE (*)    Bacteria, UA FEW (*)    All other components within normal limits  COMPREHENSIVE METABOLIC PANEL - Abnormal; Notable for the following components:   Calcium 8.2 (*)    Total Protein 6.2 (*)    Albumin 3.2 (*)    All other components within normal limits  URINE CULTURE  CBC   ____________________________________________  EKG  Not indicated ____________________________________________  RADIOLOGY  [place] Renal  Result Date: 04/18/2018 CLINICAL DATA:  22 year old pregnant female in the late 2nd trimester with acute left flank pain. EXAM: RENAL / URINARY TRACT ULTRASOUND COMPLETE COMPARISON:  CT Abdomen and Pelvis 03/29/2013 FINDINGS: Right Kidney: Length: 10.9 centimeters. Moderate right hydronephrosis (image 7). Right renal cortical echogenicity remains normal. No right renal mass. Left Kidney: Length: 11.7 centimeters. Mild left hydronephrosis (image 23). Left renal cortical echogenicity is within normal limits. No left renal mass. Bladder: Diminutive, estimated bladder volume 19 milliliters. No ureteral jets were detected with brief Doppler interrogation. 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 The hydronephrosis did not change on postvoid images. IMPRESSION: 1. Up to moderate right greater than left hydronephrosis. This patient had obstructive uropathy in 2014 due to a tiny urologic calculus, but the bilaterality in this  clinical setting may instead reflect maternal hydronephrosis of pregnancy. Consider Urology consultation. 2. Diminutive urinary bladder at the time of imaging. Electronically Signed   By: Odessa FlemingH  Hall M.D.   On: 04/18/2018 10:06    ____________________________________________   PROCEDURES  Procedure(s) performed: None  Procedures  Critical Care performed: No ____________________________________________   INITIAL IMPRESSION / ASSESSMENT AND PLAN / ED COURSE  Pertinent labs & imaging results that were available during my care of the patient were reviewed by me and considered in my medical decision making (see chart for details).  22 y.o. T1P0's [redacted] weeks pregnant with a history of renal colic presenting with left flank pain.  Overall, the patient is hemodynamically stable and afebrile.  It is possible that the patient's pain is from renal colic and the patient has not required any imaging and she has been treated successfully clinically up until this point.  However, given her ongoing pain, I have ordered a renal ultrasound for further evaluation.  The patient and I have discussed the risks and benefits of imaging requiring radiation including x-ray or CT, and  we will rediscuss this if the ultrasound does not reveal the cause of the patient's pain.  Alternate causes for the patient's pain include UTI or pyelonephritis, ovarian pathology including cyst or torsion.  Plan reevaluation for final disposition.  ----------------------------------------- 10:40 AM on 04/18/2018 -----------------------------------------  The patient does have a few bacteria with some white blood cells and leukocyte esterase but no nitrites.  Unfortunately, the patient does have 21-50 squamous cells in her urine.  A urine culture has been sent and Macrobid has been ordered.  The patient's ultrasound does show bilateral hydronephrosis.  A stone is not appreciated.  I have spoken with Dr. Ernie Hew, the urologist on-call.  The  patient's bilateral hydronephrosis may be obstructive uropathy due to mechanical obstruction from the pregnancy.  However, she may also have a left-sided stone which is contributing to this but small enough that we are unable to see it.  The urology team will consult on the patient and come see her today.  At this time, we will continue clinical treatment for presumed small left-sided stone and continue to reevaluate the patient.  The patient will have additional imaging with a full bladder for improved evaluation of her ureteral jets.  I have spoken with Dr. Logan Bores, the patient's OB/GYN, who will admit the patient.  We discussed alternative diagnoses, but both feel that the patient's clinical picture continues to be most likely renal colic.  ____________________________________________  FINAL CLINICAL IMPRESSION(S) / ED DIAGNOSES  Final diagnoses:  Left flank pain  Intractable vomiting with nausea, unspecified vomiting type  Bilateral hydronephrosis         NEW MEDICATIONS STARTED DURING THIS VISIT:  New Prescriptions   No medications on file      Rockne Menghini, MD 04/18/18 1054

## 2018-04-18 NOTE — ED Notes (Signed)
FHR documented at incorrect time. Actually checked at 0835.

## 2018-04-18 NOTE — ED Notes (Signed)
Pt unable to void at this time. Understands we need urine specimen.

## 2018-04-18 NOTE — ED Notes (Addendum)
Lorel MonacoLaura M, EDT to transport pt to Mother/Baby unit (room 247). Floor aware pt is en route.

## 2018-04-18 NOTE — Consult Note (Signed)
U/s demonstrate bilateral definitive ureteral jets, urine analysis shows no RBCs.  Hydro likely physiologic due to pregnancy.  Consider alternative sources of pain.  Will evaluate patient this afternoon if she is admitted.

## 2018-04-18 NOTE — ED Notes (Signed)
Tylenol not given at this time d/t nausea, per MD.

## 2018-04-18 NOTE — OB Triage Note (Signed)
Pt c/o left flank pain. Pain started around 5 am.

## 2018-04-18 NOTE — ED Notes (Signed)
Patient transported to US 

## 2018-04-18 NOTE — ED Triage Notes (Signed)
Pt reports was seen here Friday and told she probably had a kidney stone. Pt reports she is [redacted] weeks pregnant and they told her if the pain continued to come back. Pt reports still with severe constant pain to left flank.

## 2018-04-18 NOTE — Discharge Summary (Signed)
    L&D OB Triage Note  SUBJECTIVE Taylor Galvan is a 22 y.o. G1P0000 female at 8085w0d, EDD Estimated Date of Delivery: 07/18/18 who presented to triage with complaints of flank pain c/w reoccurrence of kidney stone.  Also has N/V.     OB History  Gravida Para Term Preterm AB Living  1 0 0 0 0 0  SAB TAB Ectopic Multiple Live Births  0 0 0 0 0    # Outcome Date GA Lbr Len/2nd Weight Sex Delivery Anes PTL Lv  1 Current             Medications Prior to Admission  Medication Sig Dispense Refill Last Dose  . acetaminophen (TYLENOL) 500 MG tablet Take 500 mg by mouth every 6 (six) hours as needed for mild pain or moderate pain.   PRN at PRN  . Prenatal-DSS-FeCb-FeGl-FA (CITRANATAL BLOOM) 90-1 MG TABS Take 90 mg by mouth daily. 30 tablet 11 04/17/2018 at 2100  . promethazine (PHENERGAN) 25 MG tablet Take 1 tablet (25 mg total) by mouth every 6 (six) hours as needed for nausea or vomiting. (Patient not taking: Reported on 04/12/2018) 30 tablet 2 Not Taking at Unknown time     OBJECTIVE  Nursing Evaluation:   BP 122/72 (BP Location: Left Arm)   Pulse 83   Temp 98 F (36.7 C)   Resp 16   Ht 5\' 8"  (1.727 m)   Wt 170 lb (77.1 kg)   LMP 10/11/2017   SpO2 99%   BMI 25.85 kg/m    Findings:   Hematuria and flank pain.     Hydroureter on U/S - no stone seen.  Ureteral jets noted bilat.  Pt received IV hydration and pain medication.  She noted her pain to progress from flank to anterior position and then resolve.  Feels well now.   Taking PO without problem.  Desires d/c  NST was performed and has been reviewed by me.  Pt seen by me and plan and condition discussed in detail.  NST INTERPRETATION: Category I  Mode: External Baseline Rate (A): 135 bpm Variability: Moderate Accelerations: 15 x 15 Decelerations: None        ASSESSMENT Impression:  1.  Pregnancy:  G1P0000 at 3485w0d , EDD Estimated Date of Delivery: 07/18/18 2.  NST:  Category I 3.  Kidney stone - now  resolved  PLAN 1. Reassurance given 2. Discharge home with standard labor precautions given to return to L&D or call the office for problems. 3. Continue routine prenatal care.

## 2018-04-19 LAB — URINE CULTURE

## 2018-04-29 ENCOUNTER — Ambulatory Visit (INDEPENDENT_AMBULATORY_CARE_PROVIDER_SITE_OTHER): Payer: 59 | Admitting: Obstetrics and Gynecology

## 2018-04-29 ENCOUNTER — Other Ambulatory Visit: Payer: 59

## 2018-04-29 VITALS — BP 120/77 | HR 82 | Wt 176.6 lb

## 2018-04-29 DIAGNOSIS — O360130 Maternal care for anti-D [Rh] antibodies, third trimester, not applicable or unspecified: Secondary | ICD-10-CM

## 2018-04-29 DIAGNOSIS — Z6791 Unspecified blood type, Rh negative: Secondary | ICD-10-CM

## 2018-04-29 DIAGNOSIS — O26899 Other specified pregnancy related conditions, unspecified trimester: Secondary | ICD-10-CM

## 2018-04-29 DIAGNOSIS — Z23 Encounter for immunization: Secondary | ICD-10-CM

## 2018-04-29 DIAGNOSIS — Z3403 Encounter for supervision of normal first pregnancy, third trimester: Secondary | ICD-10-CM

## 2018-04-29 DIAGNOSIS — Z3A28 28 weeks gestation of pregnancy: Secondary | ICD-10-CM

## 2018-04-29 LAB — POCT URINALYSIS DIPSTICK
Bilirubin, UA: NEGATIVE
GLUCOSE UA: NEGATIVE
LEUKOCYTES UA: NEGATIVE
Nitrite, UA: NEGATIVE
PH UA: 7 (ref 5.0–8.0)
Protein, UA: POSITIVE — AB
RBC UA: NEGATIVE
Spec Grav, UA: 1.015 (ref 1.010–1.025)
Urobilinogen, UA: 0.2 E.U./dL

## 2018-04-29 MED ORDER — RHO D IMMUNE GLOBULIN 1500 UNIT/2ML IJ SOSY
300.0000 ug | PREFILLED_SYRINGE | Freq: Once | INTRAMUSCULAR | Status: AC
  Administered 2018-04-29: 300 ug via INTRAMUSCULAR

## 2018-04-29 MED ORDER — TETANUS-DIPHTH-ACELL PERTUSSIS 5-2.5-18.5 LF-MCG/0.5 IM SUSP
0.5000 mL | Freq: Once | INTRAMUSCULAR | Status: AC
  Administered 2018-04-29: 0.5 mL via INTRAMUSCULAR

## 2018-04-29 NOTE — Progress Notes (Signed)
ROB-pt stated she is doing well no complaints,

## 2018-04-29 NOTE — Patient Instructions (Addendum)
[facility] Pediatrician List   [facility]  [address] [place], [place], [place] [address]  Phone: [phone]   [facility] (second location)  [address] [place], [place] [address]  Phone: [phone]   [facility]) [address] [place], [place], [place] [address] Phone: [phone]   [facility]  [address], [place], [place] [address]  Phone: [phone]       Third Trimester of Pregnancy The third trimester is from week 29 through week 42, months 7 through 9. This trimester is when your unborn baby (fetus) is growing very fast. At the end of the ninth month, the unborn baby is about 20 inches in length. It weighs about 6-10 pounds. Follow these instructions at home:  Avoid all smoking, herbs, and alcohol. Avoid drugs not approved by your doctor.  Do not use any tobacco products, including cigarettes, chewing tobacco, and electronic cigarettes. If you need help quitting, ask your doctor. You may get counseling or other support to help you quit.  Only take medicine as told by your doctor. Some medicines are safe and some are not during pregnancy.  Exercise only as told by your doctor. Stop exercising if you start having cramps.  Eat regular, healthy meals.  Wear a good support bra if your breasts are tender.  Do not use hot tubs, steam rooms, or saunas.  Wear your seat belt when driving.  Avoid raw meat, uncooked cheese, and liter boxes and soil used by cats.  Take your prenatal vitamins.  Take 1500-2000 milligrams of calcium daily starting at the 20th week of pregnancy until you deliver your baby.  Try taking medicine that helps you poop (stool softener) as needed, and if your doctor approves. Eat more fiber by eating fresh fruit, vegetables, and whole grains. Drink enough fluids to keep your pee (urine) clear or pale yellow.  Take warm water baths (sitz baths) to soothe pain or discomfort caused by  hemorrhoids. Use hemorrhoid cream if your doctor approves.  If you have puffy, bulging veins (varicose veins), wear support hose. Raise (elevate) your feet for 15 minutes, 3-4 times a day. Limit salt in your diet.  Avoid heavy lifting, wear low heels, and sit up straight.  Rest with your legs raised if you have leg cramps or low back pain.  Visit your dentist if you have not gone during your pregnancy. Use a soft toothbrush to brush your teeth. Be gentle when you floss.  You can have sex (intercourse) unless your doctor tells you not to.  Do not travel far distances unless you must. Only do so with your doctor's approval.  Take prenatal classes.  Practice driving to the hospital.  Pack your hospital bag.  Prepare the baby's room.  Go to your doctor visits. Get help if:  You are not sure if you are in labor or if your water has broken.  You are dizzy.  You have mild cramps or pressure in your lower belly (abdominal).  You have a nagging pain in your belly area.  You continue to feel sick to your stomach (nauseous), throw up (vomit), or have watery poop (diarrhea).  You have bad smelling fluid coming from your vagina.  You have pain with peeing (urination). Get help right away if:  You have a fever.  You are leaking fluid from your vagina.  You are spotting or bleeding from your vagina.  You have severe belly cramping or pain.  You lose or gain  weight rapidly.  You have trouble catching your breath and have chest pain.  You notice sudden or extreme puffiness (swelling) of your face, hands, ankles, feet, or legs.  You have not felt the baby move in over an hour.  You have severe headaches that do not go away with medicine.  You have vision changes. This information is not intended to replace advice given to you by your health care provider. Make sure you discuss any questions you have with your health care provider. Document Released: 12/13/2009 Document  Revised: 02/24/2016 Document Reviewed: 11/19/2012 Elsevier Interactive Patient Education  2017 [employer]

## 2018-04-29 NOTE — Progress Notes (Signed)
ROB: Patient doing well, no complaints. For 28 week labs today.  Desires to breastfeed,  desires combined OCPs for contraception. For Tdap and rhogam today, signed blood consent. Discussed doula program and birthing classes. Advised on getting established with a Pediatrician, counseled on circumcision, patient desires. RTC in 2 weeks.

## 2018-04-30 LAB — CBC
Hematocrit: 35.7 % (ref 34.0–46.6)
Hemoglobin: 12.1 g/dL (ref 11.1–15.9)
MCH: 30.2 pg (ref 26.6–33.0)
MCHC: 33.9 g/dL (ref 31.5–35.7)
MCV: 89 fL (ref 79–97)
Platelets: 208 10*3/uL (ref 150–450)
RBC: 4.01 x10E6/uL (ref 3.77–5.28)
RDW: 12.3 % (ref 12.3–15.4)
WBC: 9.4 10*3/uL (ref 3.4–10.8)

## 2018-04-30 LAB — GLUCOSE, 1 HOUR GESTATIONAL: Gestational Diabetes Screen: 130 mg/dL (ref 65–139)

## 2018-04-30 LAB — SYPHILIS: RPR W/REFLEX TO RPR TITER AND TREPONEMAL ANTIBODIES, TRADITIONAL SCREENING AND DIAGNOSIS ALGORITHM: RPR Ser Ql: NONREACTIVE

## 2018-05-14 ENCOUNTER — Encounter: Payer: Self-pay | Admitting: Obstetrics and Gynecology

## 2018-05-14 ENCOUNTER — Ambulatory Visit (INDEPENDENT_AMBULATORY_CARE_PROVIDER_SITE_OTHER): Payer: 59 | Admitting: Obstetrics and Gynecology

## 2018-05-14 VITALS — BP 134/86 | HR 78 | Wt 181.0 lb

## 2018-05-14 DIAGNOSIS — Z3403 Encounter for supervision of normal first pregnancy, third trimester: Secondary | ICD-10-CM

## 2018-05-14 LAB — POCT URINALYSIS DIPSTICK
Bilirubin, UA: NEGATIVE
Glucose, UA: NEGATIVE
Ketones, UA: NEGATIVE
Leukocytes, UA: NEGATIVE
NITRITE UA: NEGATIVE
Protein, UA: NEGATIVE
RBC UA: NEGATIVE
SPEC GRAV UA: 1.01 (ref 1.010–1.025)
Urobilinogen, UA: 0.2 E.U./dL
pH, UA: 7.5 (ref 5.0–8.0)

## 2018-05-14 NOTE — Progress Notes (Signed)
ROB: Patient without complaint.  Reports daily fetal movement.  Denies contractions.

## 2018-05-14 NOTE — Progress Notes (Signed)
Pt would like to discuss swelling in her feet. Otherwise no concerns at this time.

## 2018-05-28 ENCOUNTER — Ambulatory Visit (INDEPENDENT_AMBULATORY_CARE_PROVIDER_SITE_OTHER): Payer: 59 | Admitting: Obstetrics and Gynecology

## 2018-05-28 VITALS — BP 136/88 | HR 76 | Wt 185.0 lb

## 2018-05-28 DIAGNOSIS — O9989 Other specified diseases and conditions complicating pregnancy, childbirth and the puerperium: Secondary | ICD-10-CM

## 2018-05-28 DIAGNOSIS — Z87442 Personal history of urinary calculi: Secondary | ICD-10-CM

## 2018-05-28 DIAGNOSIS — Z3403 Encounter for supervision of normal first pregnancy, third trimester: Secondary | ICD-10-CM

## 2018-05-28 DIAGNOSIS — R109 Unspecified abdominal pain: Secondary | ICD-10-CM

## 2018-05-28 LAB — POCT URINALYSIS DIPSTICK OB
BILIRUBIN UA: NEGATIVE
Blood, UA: NEGATIVE
Glucose, UA: NEGATIVE
Ketones, UA: NEGATIVE
Leukocytes, UA: NEGATIVE
Nitrite, UA: NEGATIVE
PH UA: 7.5 (ref 5.0–8.0)
POC,PROTEIN,UA: NEGATIVE
Spec Grav, UA: 1.01 (ref 1.010–1.025)
UROBILINOGEN UA: 0.2 U/dL

## 2018-05-28 NOTE — Patient Instructions (Signed)
[place] Pediatrician List  [facility]  [address], [place], [place] [address]  Phone: [phone]  [facility] (second location)  [address] [place], [place] [address]  Phone: [phone]  [facility] ([place]) [address], [place], [place] [address] Phone: [phone]  [facility]  [address] [place], [place] [address]  Phone: [phone] 

## 2018-05-28 NOTE — Progress Notes (Signed)
ROB: Patient still noting mild intermittent flank pain (however happened only 1 day), thinks it may be due to her kidney stones.  Took 1/2 of a Percocet for relief. Also noting bilateral feet swelling. Discussed elevating feet, compression stockings as needed.  RTC in  2 weeks.

## 2018-05-28 NOTE — Progress Notes (Signed)
ROB- PT stated that she had to take a pain pill due to her kidney stones. Pt stated her feet are swelling. No complaints.

## 2018-06-11 ENCOUNTER — Ambulatory Visit (INDEPENDENT_AMBULATORY_CARE_PROVIDER_SITE_OTHER): Payer: 59 | Admitting: Obstetrics and Gynecology

## 2018-06-11 VITALS — BP 154/94 | HR 75 | Wt 188.0 lb

## 2018-06-11 DIAGNOSIS — O133 Gestational [pregnancy-induced] hypertension without significant proteinuria, third trimester: Secondary | ICD-10-CM

## 2018-06-11 DIAGNOSIS — Z3403 Encounter for supervision of normal first pregnancy, third trimester: Secondary | ICD-10-CM

## 2018-06-11 LAB — CBC WITH DIFFERENTIAL/PLATELET
Basophils Absolute: 0 10*3/uL (ref 0.0–0.2)
Basos: 0 %
EOS (ABSOLUTE): 0.1 10*3/uL (ref 0.0–0.4)
EOS: 1 %
HEMATOCRIT: 37 % (ref 34.0–46.6)
HEMOGLOBIN: 13.2 g/dL (ref 11.1–15.9)
IMMATURE GRANS (ABS): 0.1 10*3/uL (ref 0.0–0.1)
IMMATURE GRANULOCYTES: 1 %
LYMPHS ABS: 1.5 10*3/uL (ref 0.7–3.1)
Lymphs: 18 %
MCH: 32.1 pg (ref 26.6–33.0)
MCHC: 35.7 g/dL (ref 31.5–35.7)
MCV: 90 fL (ref 79–97)
MONOCYTES: 9 %
Monocytes Absolute: 0.8 10*3/uL (ref 0.1–0.9)
Neutrophils Absolute: 5.9 10*3/uL (ref 1.4–7.0)
Neutrophils: 71 %
Platelets: 193 10*3/uL (ref 150–450)
RBC: 4.11 x10E6/uL (ref 3.77–5.28)
RDW: 13.1 % (ref 12.3–15.4)
WBC: 8.3 10*3/uL (ref 3.4–10.8)

## 2018-06-11 LAB — COMPREHENSIVE METABOLIC PANEL
ALT: 9 IU/L (ref 0–32)
AST: 11 IU/L (ref 0–40)
Albumin/Globulin Ratio: 1.8 (ref 1.2–2.2)
Albumin: 3.5 g/dL (ref 3.5–5.5)
Alkaline Phosphatase: 91 IU/L (ref 39–117)
BUN / CREAT RATIO: 10 (ref 9–23)
BUN: 5 mg/dL — AB (ref 6–20)
Bilirubin Total: 0.4 mg/dL (ref 0.0–1.2)
CALCIUM: 8.6 mg/dL — AB (ref 8.7–10.2)
CO2: 19 mmol/L — AB (ref 20–29)
CREATININE: 0.48 mg/dL — AB (ref 0.57–1.00)
Chloride: 104 mmol/L (ref 96–106)
GFR calc non Af Amer: 138 mL/min/{1.73_m2} (ref >59)
GFR, EST AFRICAN AMERICAN: 159 mL/min/{1.73_m2} (ref >59)
GLUCOSE: 78 mg/dL (ref 65–99)
Globulin, Total: 2 g/dL (ref 1.5–4.5)
Potassium: 3.9 mmol/L (ref 3.5–5.2)
Sodium: 139 mmol/L (ref 134–144)
TOTAL PROTEIN: 5.5 g/dL — AB (ref 6.0–8.5)

## 2018-06-11 LAB — PROTEIN / CREATININE RATIO, URINE
Creatinine, Urine: 100.5 mg/dL
PROTEIN UR: 18.6 mg/dL
PROTEIN/CREAT RATIO: 185 mg/g{creat} (ref 0–200)

## 2018-06-11 LAB — POCT URINALYSIS DIPSTICK OB
Bilirubin, UA: NEGATIVE
Blood, UA: NEGATIVE
Glucose, UA: NEGATIVE
Ketones, UA: NEGATIVE
LEUKOCYTES UA: NEGATIVE
Nitrite, UA: NEGATIVE
POC,PROTEIN,UA: NEGATIVE
Spec Grav, UA: 1.01 (ref 1.010–1.025)
Urobilinogen, UA: 0.2 E.U./dL
pH, UA: 7.5 (ref 5.0–8.0)

## 2018-06-11 NOTE — Progress Notes (Signed)
ROB: Patient feels well without associated symptoms.  Reports occasional end of the day lower extremity edema.  Elevated blood pressure noted.  Negative proteinuria.  CBC, CMP and UPC performed.  Close follow-up Friday.  Consider NST and/or ultrasound for growth if worsening hypertension.  Signs and symptoms discussed with the patient warnings given.

## 2018-06-11 NOTE — Progress Notes (Signed)
Pt presents today for ROB. Pt is worried about BP being high the last few visits. Otherwise, she has no concerns.

## 2018-06-12 ENCOUNTER — Telehealth: Payer: Self-pay | Admitting: Obstetrics and Gynecology

## 2018-06-12 NOTE — Telephone Encounter (Signed)
Coming in Friday to do BP check, but the patient states her tongue is really red and she coughed last night and saw blood. She is asking for a call back.  She is not sure what to do.  Please advise, thanks.

## 2018-06-13 NOTE — Telephone Encounter (Signed)
Pt called no answer LM via voicemail to call the office to speak more about her call.

## 2018-06-14 ENCOUNTER — Ambulatory Visit (INDEPENDENT_AMBULATORY_CARE_PROVIDER_SITE_OTHER): Payer: 59 | Admitting: Obstetrics and Gynecology

## 2018-06-14 VITALS — BP 134/79 | HR 78 | Wt 187.9 lb

## 2018-06-14 DIAGNOSIS — Z3403 Encounter for supervision of normal first pregnancy, third trimester: Secondary | ICD-10-CM

## 2018-06-14 LAB — POCT URINALYSIS DIPSTICK OB
BILIRUBIN UA: NEGATIVE
Blood, UA: NEGATIVE
Glucose, UA: NEGATIVE
Ketones, UA: NEGATIVE
LEUKOCYTES UA: NEGATIVE
Nitrite, UA: NEGATIVE
PH UA: 7 (ref 5.0–8.0)
POC,PROTEIN,UA: NEGATIVE
Spec Grav, UA: 1.01 (ref 1.010–1.025)
UROBILINOGEN UA: 0.2 U/dL

## 2018-06-14 MED ORDER — FLUCONAZOLE 150 MG PO TABS: 150.0000 mg | ORAL_TABLET | Freq: Once | ORAL | 3 refills | Status: AC

## 2018-06-14 MED ORDER — METRONIDAZOLE 500 MG PO TABS: 500.0000 mg | ORAL_TABLET | Freq: Two times a day (BID) | ORAL | 0 refills | Status: DC

## 2018-06-14 NOTE — Progress Notes (Signed)
ROB: Patient presents for f/u for elevated BP noted on most recent visit 3 days ago.  Today's BP wnl, no proteinura, had negative labs (no evidence of pre-eclampsia). Discussed that if she has subsequent elevated BPs, she will most likely be diagnosed with GHTN and will continue to undergo further evaluation to rule out pre-eclampsia. Given PIH precautions.  RTC in 1 week, for 36 week cultures at that time.

## 2018-06-14 NOTE — Patient Instructions (Addendum)

## 2018-06-14 NOTE — Progress Notes (Signed)
   ROB-PT stated that she is doing well no complaints.    

## 2018-06-17 NOTE — Telephone Encounter (Signed)
Pt came into the office on Friday, June 14, 2018 for [facility]. Pt spoke with [facility] about her concerns.

## 2018-06-24 ENCOUNTER — Ambulatory Visit (INDEPENDENT_AMBULATORY_CARE_PROVIDER_SITE_OTHER): Payer: 59 | Admitting: Obstetrics and Gynecology

## 2018-06-24 VITALS — BP 132/78 | HR 88 | Wt 190.9 lb

## 2018-06-24 DIAGNOSIS — Z3685 Encounter for antenatal screening for Streptococcus B: Secondary | ICD-10-CM

## 2018-06-24 DIAGNOSIS — Z3403 Encounter for supervision of normal first pregnancy, third trimester: Secondary | ICD-10-CM | POA: Diagnosis not present

## 2018-06-24 LAB — POCT URINALYSIS DIPSTICK OB
BILIRUBIN UA: NEGATIVE
Blood, UA: NEGATIVE
Glucose, UA: NEGATIVE
KETONES UA: NEGATIVE
Leukocytes, UA: NEGATIVE
Nitrite, UA: NEGATIVE
Spec Grav, UA: 1.01 (ref 1.010–1.025)
Urobilinogen, UA: 0.2 E.U./dL
pH, UA: 7.5 (ref 5.0–8.0)

## 2018-06-24 NOTE — Progress Notes (Signed)
   ROB-PT stated that she is doing well no complaints.    

## 2018-06-24 NOTE — Progress Notes (Signed)
ROB: Patient doing well, no complaints. 36 week labs done today. Discussed labor precautions. BP today wnl (although patient does report an elevated one over the weekend at [facility]). Continue to monitor. RTC in 1 week.

## 2018-06-26 LAB — STREP GP B NAA: STREP GROUP B AG: NEGATIVE

## 2018-06-26 LAB — GC/CHLAMYDIA PROBE AMP
CHLAMYDIA, DNA PROBE: NEGATIVE
NEISSERIA GONORRHOEAE BY PCR: NEGATIVE

## 2018-06-28 NOTE — Progress Notes (Signed)
Pt presents today for ROB. Pt states she is experiencing braxton hicks. Pt also claims that she has had some pain that she believes was caused by her 36 week cultures. Pt may need to be checked for a UTI per Dr. Valentino Saxon.

## 2018-07-01 ENCOUNTER — Ambulatory Visit (INDEPENDENT_AMBULATORY_CARE_PROVIDER_SITE_OTHER): Payer: 59 | Admitting: Obstetrics and Gynecology

## 2018-07-01 ENCOUNTER — Encounter: Payer: Self-pay | Admitting: Obstetrics and Gynecology

## 2018-07-01 VITALS — BP 144/84 | HR 75 | Wt 195.0 lb

## 2018-07-01 DIAGNOSIS — Z3403 Encounter for supervision of normal first pregnancy, third trimester: Secondary | ICD-10-CM | POA: Diagnosis not present

## 2018-07-01 LAB — POCT URINALYSIS DIPSTICK OB
BILIRUBIN UA: NEGATIVE
Blood, UA: NEGATIVE
GLUCOSE, UA: NEGATIVE
KETONES UA: NEGATIVE
Leukocytes, UA: NEGATIVE
NITRITE UA: NEGATIVE
PROTEIN: NEGATIVE
Spec Grav, UA: 1.01 (ref 1.010–1.025)
Urobilinogen, UA: 0.2 E.U./dL
pH, UA: 7.5 (ref 5.0–8.0)

## 2018-07-01 NOTE — Progress Notes (Signed)
ROB: Occasional Braxton Hicks contractions.  Signs and symptoms of labor discussed.

## 2018-07-08 ENCOUNTER — Other Ambulatory Visit: Payer: Self-pay

## 2018-07-08 ENCOUNTER — Observation Stay (HOSPITAL_BASED_OUTPATIENT_CLINIC_OR_DEPARTMENT_OTHER)
Admission: EM | Admit: 2018-07-08 | Discharge: 2018-07-08 | Disposition: A | Discharge status: Home / Self Care | Payer: 59 | Source: Home / Self Care | Admitting: Obstetrics and Gynecology

## 2018-07-08 ENCOUNTER — Ambulatory Visit (INDEPENDENT_AMBULATORY_CARE_PROVIDER_SITE_OTHER): Payer: 59 | Admitting: Obstetrics and Gynecology

## 2018-07-08 VITALS — BP 144/81 | HR 75 | Wt 197.2 lb

## 2018-07-08 DIAGNOSIS — O133 Gestational [pregnancy-induced] hypertension without significant proteinuria, third trimester: Secondary | ICD-10-CM

## 2018-07-08 DIAGNOSIS — Z3A34 34 weeks gestation of pregnancy: Secondary | ICD-10-CM

## 2018-07-08 DIAGNOSIS — Z3483 Encounter for supervision of other normal pregnancy, third trimester: Secondary | ICD-10-CM

## 2018-07-08 DIAGNOSIS — Z3A38 38 weeks gestation of pregnancy: Secondary | ICD-10-CM

## 2018-07-08 LAB — COMPREHENSIVE METABOLIC PANEL
ALK PHOS: 104 U/L (ref 38–126)
ALT: 13 U/L (ref 0–44)
AST: 18 U/L (ref 15–41)
Albumin: 3 g/dL — ABNORMAL LOW (ref 3.5–5.0)
Anion gap: 8 (ref 5–15)
BUN: 6 mg/dL (ref 6–20)
CALCIUM: 8.8 mg/dL — AB (ref 8.9–10.3)
CHLORIDE: 108 mmol/L (ref 98–111)
CO2: 22 mmol/L (ref 22–32)
CREATININE: 0.45 mg/dL (ref 0.44–1.00)
GFR calc Af Amer: >60 mL/min (ref >60)
Glucose, Bld: 74 mg/dL (ref 70–99)
Potassium: 3.6 mmol/L (ref 3.5–5.1)
SODIUM: 138 mmol/L (ref 135–145)
Total Bilirubin: 0.7 mg/dL (ref 0.3–1.2)
Total Protein: 6.3 g/dL — ABNORMAL LOW (ref 6.5–8.1)

## 2018-07-08 LAB — POCT URINALYSIS DIPSTICK OB
Bilirubin, UA: NEGATIVE
Blood, UA: NEGATIVE
Glucose, UA: NEGATIVE
KETONES UA: NEGATIVE
LEUKOCYTES UA: NEGATIVE
NITRITE UA: NEGATIVE
PH UA: 7.5 (ref 5.0–8.0)
PROTEIN: NEGATIVE
Spec Grav, UA: 1.01 (ref 1.010–1.025)
Urobilinogen, UA: 0.2 E.U./dL

## 2018-07-08 LAB — CBC
HCT: 40 % (ref 35.0–47.0)
HEMOGLOBIN: 13.7 g/dL (ref 12.0–16.0)
MCH: 31.2 pg (ref 26.0–34.0)
MCHC: 34.4 g/dL (ref 32.0–36.0)
MCV: 90.9 fL (ref 80.0–100.0)
PLATELETS: 162 10*3/uL (ref 150–440)
RBC: 4.4 MIL/uL (ref 3.80–5.20)
RDW: 13.5 % (ref 11.5–14.5)
WBC: 9.4 10*3/uL (ref 3.6–11.0)

## 2018-07-08 LAB — PROTEIN / CREATININE RATIO, URINE
Creatinine, Urine: 74 mg/dL
Protein Creatinine Ratio: 0.18 mg/mg{Cre} — ABNORMAL HIGH (ref 0.00–0.15)
Total Protein, Urine: 13 mg/dL

## 2018-07-08 MED ORDER — SODIUM CHLORIDE 0.9 % IJ SOLN
INTRAMUSCULAR | Status: AC
  Filled 2018-07-08: qty 50

## 2018-07-08 MED ORDER — LABETALOL HCL 5 MG/ML IV SOLN: 20.0000 mg | INTRAVENOUS | Status: DC | PRN

## 2018-07-08 MED ORDER — HYDRALAZINE HCL 20 MG/ML IJ SOLN: 10.0000 mg | INTRAMUSCULAR | Status: DC | PRN

## 2018-07-08 MED ORDER — SODIUM CHLORIDE FLUSH 0.9 % IV SOLN
INTRAVENOUS | Status: AC
  Filled 2018-07-08: qty 10

## 2018-07-08 MED ORDER — LABETALOL HCL 5 MG/ML IV SOLN: 40.0000 mg | INTRAVENOUS | Status: DC | PRN

## 2018-07-08 MED ORDER — LABETALOL HCL 5 MG/ML IV SOLN: 80.0000 mg | INTRAVENOUS | Status: DC | PRN

## 2018-07-08 NOTE — OB Triage Note (Signed)
Patient discharged home ambulatory and in stable condition. 

## 2018-07-08 NOTE — OB Triage Note (Signed)
Patient arrived on unit from office for [facility] evaluation.  Patient has no complaints.  Oriented to room and plan of care.

## 2018-07-08 NOTE — Progress Notes (Signed)
   ROB-PT stated that she is doing well no complaints.    

## 2018-07-08 NOTE — Progress Notes (Signed)
ROB: Still noting Braxton Hicks ctx,, denies other complaints. Discussed labor precautions. Patient with mildly elevated BP again today (140s/80s, similar to last week, however repeat today 165/89).  Also with an elevated BP at ~ 34 weeks. Likely with GHTN. Advised on need for IOL.  Will send to L&D for further evaluation, if BPs decrease can be scheduled for IOL at 39 weeks, otherwise, if severe range BPs continue or evidence of pre-eclampsia noted on labs, will induce this afternoon.

## 2018-07-08 NOTE — Discharge Instructions (Signed)
Return for induction of labor 10/10 at 0600.  Enter the hospital through the Emergency Department, and you will be escorted to [facility].

## 2018-07-09 NOTE — Final Progress Note (Addendum)
L&D OB Triage Note  Taylor Galvan is a 22 y.o. G1P0000 female at [redacted]w[redacted]d, EDD Estimated Date of Delivery: 07/18/18 with GHTN who presented to triage from clinic for further evaluation of elevated blood pressures (BPs 160s/90s in clinic).  She denied complaints of headache, blurred vision, or RUQ pain. She was evaluated by the nurses with no significant findings for pre-eclampsia. Vital signs reviewed. An NST was performed and has been reviewed by MD.    Vitals:  Vitals:   07/08/18 1547 07/08/18 1602 07/08/18 1617 07/08/18 1632  BP: 140/89 (!) 139/91 (!) 144/87 (!) 141/87  Pulse: 76 74 74 82  Resp:      Temp:      TempSrc:      Weight:        NST INTERPRETATION: Indications: pregnancy-induced hypertension and patient reassurance  Mode: External Baseline Rate (A): 160 bpm Variability: Moderate Accelerations: 15 x 15 Decelerations: None     Contraction Frequency (min): Occasional  Impression: reactive   Labs:  Results for orders placed or performed during the hospital encounter of 07/08/18  CBC  Result Value Ref Range   WBC 9.4 3.6 - 11.0 K/uL   RBC 4.40 3.80 - 5.20 MIL/uL   Hemoglobin 13.7 12.0 - 16.0 g/dL   HCT 91.4 78.2 - 95.6 %   MCV 90.9 80.0 - 100.0 fL   MCH 31.2 26.0 - 34.0 pg   MCHC 34.4 32.0 - 36.0 g/dL   RDW 21.3 08.6 - 57.8 %   Platelets 162 150 - 440 K/uL  Comprehensive metabolic panel  Result Value Ref Range   Sodium 138 135 - 145 mmol/L   Potassium 3.6 3.5 - 5.1 mmol/L   Chloride 108 98 - 111 mmol/L   CO2 22 22 - 32 mmol/L   Glucose, Bld 74 70 - 99 mg/dL   BUN 6 6 - 20 mg/dL   Creatinine, Ser 4.69 0.44 - 1.00 mg/dL   Calcium 8.8 (L) 8.9 - 10.3 mg/dL   Total Protein 6.3 (L) 6.5 - 8.1 g/dL   Albumin 3.0 (L) 3.5 - 5.0 g/dL   AST 18 15 - 41 U/L   ALT 13 0 - 44 U/L   Alkaline Phosphatase 104 38 - 126 U/L   Total Bilirubin 0.7 0.3 - 1.2 mg/dL   GFR calc non Af Amer >60 >60 mL/min   GFR calc Af Amer >60 >60 mL/min   Anion gap 8 5 - 15  Protein /  creatinine ratio, urine  Result Value Ref Range   Creatinine, Urine 74 mg/dL   Total Protein, Urine 13 mg/dL   Protein Creatinine Ratio 0.18 (H) 0.00 - 0.15 mg/mg[Cre]     Plan: NST performed was reviewed and was found to be reactive. She was discharged home with PIH and labor precautions.  Continue routine prenatal care. She was scheduled for induction of labor on Thursday, 07/11/18.     Hildred Laser, MD [facility]

## 2018-07-10 NOTE — Addendum Note (Signed)
Addended by: Elonda Husky on: 07/10/2018 10:19 AM   Modules accepted: Orders, SmartSet

## 2018-07-11 ENCOUNTER — Inpatient Hospital Stay: Payer: 59 | Admitting: Anesthesiology

## 2018-07-11 ENCOUNTER — Other Ambulatory Visit: Payer: Self-pay

## 2018-07-11 ENCOUNTER — Inpatient Hospital Stay
Admission: EM | Admit: 2018-07-11 | Discharge: 2018-07-14 | DRG: 807 | Disposition: A | Discharge status: Home / Self Care | Payer: 59 | Attending: Obstetrics and Gynecology | Admitting: Obstetrics and Gynecology

## 2018-07-11 DIAGNOSIS — Z87442 Personal history of urinary calculi: Secondary | ICD-10-CM

## 2018-07-11 DIAGNOSIS — O480 Post-term pregnancy: Secondary | ICD-10-CM | POA: Diagnosis present

## 2018-07-11 DIAGNOSIS — O26893 Other specified pregnancy related conditions, third trimester: Secondary | ICD-10-CM | POA: Diagnosis present

## 2018-07-11 DIAGNOSIS — Z87891 Personal history of nicotine dependence: Secondary | ICD-10-CM | POA: Diagnosis not present

## 2018-07-11 DIAGNOSIS — O134 Gestational [pregnancy-induced] hypertension without significant proteinuria, complicating childbirth: Secondary | ICD-10-CM | POA: Diagnosis present

## 2018-07-11 DIAGNOSIS — O133 Gestational [pregnancy-induced] hypertension without significant proteinuria, third trimester: Secondary | ICD-10-CM

## 2018-07-11 DIAGNOSIS — Z3A39 39 weeks gestation of pregnancy: Secondary | ICD-10-CM

## 2018-07-11 DIAGNOSIS — Z6791 Unspecified blood type, Rh negative: Secondary | ICD-10-CM | POA: Diagnosis not present

## 2018-07-11 LAB — CBC
HEMATOCRIT: 38.5 % (ref 36.0–46.0)
Hemoglobin: 13.3 g/dL (ref 12.0–15.0)
MCH: 31.1 pg (ref 26.0–34.0)
MCHC: 34.5 g/dL (ref 30.0–36.0)
MCV: 90 fL (ref 80.0–100.0)
PLATELETS: 182 10*3/uL (ref 150–400)
RBC: 4.28 MIL/uL (ref 3.87–5.11)
RDW: 13.1 % (ref 11.5–15.5)
WBC: 10.5 10*3/uL (ref 4.0–10.5)
nRBC: 0 % (ref 0.0–0.2)

## 2018-07-11 LAB — ABO/RH: ABO/RH(D): O NEG

## 2018-07-11 MED ORDER — PHENYLEPHRINE 40 MCG/ML (10ML) SYRINGE FOR IV PUSH (FOR BLOOD PRESSURE SUPPORT)
80.0000 ug | PREFILLED_SYRINGE | INTRAVENOUS | Status: DC | PRN
  Filled 2018-07-11: qty 5

## 2018-07-11 MED ORDER — OXYTOCIN 40 UNITS IN LACTATED RINGERS INFUSION - SIMPLE MED
INTRAVENOUS | Status: AC
  Filled 2018-07-11: qty 1000

## 2018-07-11 MED ORDER — EPHEDRINE 5 MG/ML INJ
10.0000 mg | INTRAVENOUS | Status: DC | PRN
  Filled 2018-07-11: qty 2

## 2018-07-11 MED ORDER — LACTATED RINGERS IV SOLN
INTRAVENOUS | Status: DC
  Administered 2018-07-11 (×3): via INTRAVENOUS

## 2018-07-11 MED ORDER — MISOPROSTOL 200 MCG PO TABS
ORAL_TABLET | ORAL | Status: AC
  Filled 2018-07-11: qty 4

## 2018-07-11 MED ORDER — DIPHENHYDRAMINE HCL 50 MG/ML IJ SOLN: 12.5000 mg | INTRAMUSCULAR | Status: DC | PRN

## 2018-07-11 MED ORDER — LIDOCAINE HCL (PF) 1 % IJ SOLN
INTRAMUSCULAR | Status: DC | PRN
  Administered 2018-07-11: 3 mL

## 2018-07-11 MED ORDER — LIDOCAINE HCL (PF) 1 % IJ SOLN
INTRAMUSCULAR | Status: AC
  Filled 2018-07-11: qty 30

## 2018-07-11 MED ORDER — OXYTOCIN BOLUS FROM INFUSION
500.0000 mL | Freq: Once | INTRAVENOUS | Status: AC
  Administered 2018-07-12: 500 mL via INTRAVENOUS

## 2018-07-11 MED ORDER — LIDOCAINE HCL (PF) 1 % IJ SOLN: 30.0000 mL | INTRAMUSCULAR | Status: DC | PRN

## 2018-07-11 MED ORDER — BUPIVACAINE HCL (PF) 0.25 % IJ SOLN
INTRAMUSCULAR | Status: DC | PRN
  Administered 2018-07-11 (×2): 4 mL via EPIDURAL

## 2018-07-11 MED ORDER — OXYTOCIN 40 UNITS IN LACTATED RINGERS INFUSION - SIMPLE MED
1.0000 m[IU]/min | INTRAVENOUS | Status: DC
  Administered 2018-07-11: 4 m[IU]/min via INTRAVENOUS

## 2018-07-11 MED ORDER — OXYTOCIN 10 UNIT/ML IJ SOLN
INTRAMUSCULAR | Status: AC
  Filled 2018-07-11: qty 2

## 2018-07-11 MED ORDER — BUTORPHANOL TARTRATE 1 MG/ML IJ SOLN: 1.0000 mg | INTRAMUSCULAR | Status: DC | PRN

## 2018-07-11 MED ORDER — FENTANYL 2.5 MCG/ML W/ROPIVACAINE 0.15% IN NS 100 ML EPIDURAL (ARMC)
EPIDURAL | Status: AC
  Filled 2018-07-11: qty 100

## 2018-07-11 MED ORDER — OXYTOCIN 40 UNITS IN LACTATED RINGERS INFUSION - SIMPLE MED: 2.5000 [IU]/h | INTRAVENOUS | Status: DC

## 2018-07-11 MED ORDER — AMMONIA AROMATIC IN INHA
RESPIRATORY_TRACT | Status: AC
  Filled 2018-07-11: qty 10

## 2018-07-11 MED ORDER — LACTATED RINGERS IV SOLN: 500.0000 mL | INTRAVENOUS | Status: DC | PRN

## 2018-07-11 MED ORDER — SOD CITRATE-CITRIC ACID 500-334 MG/5ML PO SOLN
30.0000 mL | ORAL | Status: DC | PRN
  Filled 2018-07-11: qty 30

## 2018-07-11 MED ORDER — MISOPROSTOL 50MCG HALF TABLET
ORAL_TABLET | ORAL | Status: AC
  Filled 2018-07-11: qty 1

## 2018-07-11 MED ORDER — LACTATED RINGERS IV SOLN: 500.0000 mL | Freq: Once | INTRAVENOUS | Status: DC

## 2018-07-11 MED ORDER — LIDOCAINE-EPINEPHRINE (PF) 1.5 %-1:200000 IJ SOLN
INTRAMUSCULAR | Status: DC | PRN
  Administered 2018-07-11: 3 mL via PERINEURAL

## 2018-07-11 MED ORDER — ACETAMINOPHEN 325 MG PO TABS: 650.0000 mg | ORAL_TABLET | ORAL | Status: DC | PRN

## 2018-07-11 MED ORDER — TERBUTALINE SULFATE 1 MG/ML IJ SOLN: 0.2500 mg | Freq: Once | INTRAMUSCULAR | Status: DC | PRN

## 2018-07-11 MED ORDER — FENTANYL 2.5 MCG/ML W/ROPIVACAINE 0.15% IN NS 100 ML EPIDURAL (ARMC)
12.0000 mL/h | EPIDURAL | Status: DC
  Administered 2018-07-11 (×2): 12 mL/h via EPIDURAL
  Filled 2018-07-11: qty 100

## 2018-07-11 MED ORDER — BUPIVACAINE HCL (PF) 0.25 % IJ SOLN: INTRAMUSCULAR | Status: DC | PRN

## 2018-07-11 MED ORDER — ONDANSETRON HCL 4 MG/2ML IJ SOLN: 4.0000 mg | Freq: Four times a day (QID) | INTRAMUSCULAR | Status: DC | PRN

## 2018-07-11 MED ORDER — MISOPROSTOL 25 MCG QUARTER TABLET
50.0000 ug | ORAL_TABLET | ORAL | Status: DC
  Administered 2018-07-11 (×2): 50 ug via VAGINAL
  Filled 2018-07-11: qty 1

## 2018-07-11 NOTE — Anesthesia Preprocedure Evaluation (Signed)
Anesthesia Evaluation  Patient identified by MRN, date of birth, ID band Patient awake    Reviewed: Allergy & Precautions, H&P , NPO status   History of Anesthesia Complications Negative for: history of anesthetic complications  Airway Mallampati: II  TM Distance: >3 FB Neck ROM: full    Dental no notable dental hx.    Pulmonary neg pulmonary ROS, former smoker,    Pulmonary exam normal        Cardiovascular hypertension, Normal cardiovascular exam     Neuro/Psych negative psych ROS   GI/Hepatic negative GI ROS, Neg liver ROS,   Endo/Other  negative endocrine ROS  Renal/GU Renal diseaseKidney Stones  negative genitourinary   Musculoskeletal   Abdominal   Peds  Hematology negative hematology ROS (+)   Anesthesia Other Findings   Reproductive/Obstetrics (+) Pregnancy                             Anesthesia Physical Anesthesia Plan  ASA: II  Anesthesia Plan: Epidural   Post-op Pain Management:    Induction:   PONV Risk Score and Plan:   Airway Management Planned:   Additional Equipment:   Intra-op Plan:   Post-operative Plan:   Informed Consent: I have reviewed the patients History and Physical, chart, labs and discussed the procedure including the risks, benefits and alternatives for the proposed anesthesia with the patient or authorized representative who has indicated his/her understanding and acceptance.     Plan Discussed with: Anesthesiologist  Anesthesia Plan Comments:         Anesthesia Quick Evaluation

## 2018-07-11 NOTE — Anesthesia Procedure Notes (Signed)
Epidural Patient location during procedure: OB Start time: 07/11/2018 2:07 PM End time: 07/11/2018 2:17 PM  Staffing Anesthesiologist: Piscitello, Cleda Mccreedy, MD Resident/CRNA: Stormy Fabian, CRNA Performed: resident/CRNA   Preanesthetic Checklist Completed: patient identified, site marked, surgical consent, pre-op evaluation, timeout performed, IV checked, risks and benefits discussed and monitors and equipment checked  Epidural Patient position: sitting Prep: ChloraPrep Patient monitoring: heart rate, continuous pulse ox and blood pressure Approach: midline Location: L3-L4 Injection technique: LOR saline  Needle:  Needle type: Tuohy  Needle gauge: 17 G Needle length: 9 cm and 9 Needle insertion depth: 7 cm Catheter type: closed end flexible Catheter size: 19 Gauge Catheter at skin depth: 12 cm Test dose: negative and 1.5% lidocaine with Epi 1:200 K  Assessment Sensory level: T10 Events: blood not aspirated, injection not painful, no injection resistance, negative IV test and no paresthesia  Additional Notes 1 attempt Pt. Evaluated and documentation done after procedure finished. Patient identified. Risks/Benefits/Options discussed with patient including but not limited to bleeding, infection, nerve damage, paralysis, failed block, incomplete pain control, headache, blood pressure changes, nausea, vomiting, reactions to medication both or allergic, itching and postpartum back pain. Confirmed with bedside nurse the patient's most recent platelet count. Confirmed with patient that they are not currently taking any anticoagulation, have any bleeding history or any family history of bleeding disorders. Patient expressed understanding and wished to proceed. All questions were answered. Sterile technique was used throughout the entire procedure. Please see nursing notes for vital signs. Test dose was given through epidural catheter and negative prior to continuing to dose epidural or  start infusion. Warning signs of high block given to the patient including shortness of breath, tingling/numbness in hands, complete motor block, or any concerning symptoms with instructions to call for help. Patient was given instructions on fall risk and not to get out of bed. All questions and concerns addressed with instructions to call with any issues or inadequate analgesia.   Patient tolerated the insertion well without immediate complications.Reason for block:procedure for pain

## 2018-07-11 NOTE — Progress Notes (Signed)
Patient ID: Taylor Galvan, female   DOB: [birth year], 22 y.o.   MRN: [id number]   LABOR NOTE   Taylor Galvan 22 y.o.@ at [redacted]w[redacted]d Early latent labor.  SUBJECTIVE:  Pt now comfortable with epidural OBJECTIVE:  BP 121/72   Pulse 81   Temp 98.4 F (36.9 C) (Oral)   Resp 16   Ht 5\' 8"  (1.727 m)   Wt 89.4 kg   LMP 10/11/2017   SpO2 97%   BMI 29.95 kg/m  No intake/output data recorded.  She has shown cervical change. CERVIX: 4cm:  100%:   -2:   mid position:   soft SVE:   Dilation: 4 Effacement (%): 100 Station: -2, -1 Exam by:: Dr. Logan Bores CONTRACTIONS: irregular, every 3-5 minutes FHR: Fetal heart tracing reviewed. Variability: Good {> 6 bpm) Category I   Analgesia: Epidural   IUPC placed without problem  Labs: Lab Results  Component Value Date   WBC 10.5 07/11/2018   HGB 13.3 07/11/2018   HCT 38.5 07/11/2018   MCV 90.0 07/11/2018   PLT 182 07/11/2018    ASSESSMENT: 1) Labor curve reviewed.       Progress: Early latent labor.     Membranes: intact, ruptured     Inadequate contractions       PLAN: IV Pitocin augmentation  Elonda Husky, M.D. 07/11/2018 4:46 PM

## 2018-07-11 NOTE — H&P (Signed)
History and Physical   HPI  Taylor Galvan is a 22 y.o. G1P0000 at [redacted]w[redacted]d Estimated Date of Delivery: 07/18/18 who is being admitted for  induction of labor for pregnancy-induced hypertension without severe features.   OB History  OB History  Gravida Para Term Preterm AB Living  1 0 0 0 0 0  SAB TAB Ectopic Multiple Live Births  0 0 0 0 0    # Outcome Date GA Lbr Len/2nd Weight Sex Delivery Anes PTL Lv  1 Current             PROBLEM LIST  Pregnancy complications or risks: Patient Active Problem List   Diagnosis Date Noted  . Post-dates pregnancy 07/11/2018  . Gestational hypertension, third trimester 07/08/2018  . Labor and delivery indication for care or intervention 07/08/2018  . Kidney stone complicating pregnancy, third trimester 04/18/2018  . Kidney stone 04/18/2018  . Pregnancy 04/13/2018  . Rh negative state in antepartum period 01/04/2018     Prenatal labs and studies: ABO, Rh: --/--/O NEG (10/10 0732) Antibody: POS (10/10 0732) Rubella: 2.35 (03/11 1541) RPR: Non Reactive (07/29 1059)  HBsAg: Negative (03/11 1541)  HIV: Non Reactive (03/11 1541)  BJY:NWGNFAOZ (09/23 1452)   Past Medical History:  Diagnosis Date  . Kidney stone      Past Surgical History:  Procedure Laterality Date  . NO PAST SURGERIES       Medications    Current Discharge Medication List    CONTINUE these medications which have NOT CHANGED   Details  acetaminophen (TYLENOL) 500 MG tablet Take 500 mg by mouth every 6 (six) hours as needed for mild pain or moderate pain.    oxyCODONE-acetaminophen (PERCOCET) 5-325 MG tablet Take 1 tablet by mouth every 4 (four) hours as needed for severe pain. Qty: 12 tablet, Refills: 0    Prenatal-DSS-FeCb-FeGl-FA (CITRANATAL BLOOM) 90-1 MG TABS Take 90 mg by mouth daily. Qty: 30 tablet, Refills: 11         Allergies  Patient has no known allergies.  Review of Systems  Pertinent items are noted in HPI.  Physical  Exam  BP (!) 143/82 (BP Location: Right Arm)   Pulse 73   Temp 98.7 F (37.1 C) (Oral)   Resp 16   Ht 5\' 8"  (1.727 m)   Wt 89.4 kg   LMP 10/11/2017   BMI 29.95 kg/m   Lungs:  CTA B Cardio: RRR without M/R/G Abd: Soft, gravid, NT Presentation: cephalic EXT: No C/C/ 1+ Edema DTRs: 2+ B CERVIX: Dilation: Closed Effacement (%): Thick Cervical Position: Middle Presentation: Vertex Exam by:: Dr.Evans   See Prenatal records for more detailed PE.     FHR:  Variability: Good {> 6 bpm)  Toco: Uterine Contractions: None    Test Results  Results for orders placed or performed during the hospital encounter of 07/11/18 (from the past 24 hour(s))  CBC     Status: None   Collection Time: 07/11/18  6:16 AM  Result Value Ref Range   WBC 10.5 4.0 - 10.5 K/uL   RBC 4.28 3.87 - 5.11 MIL/uL   Hemoglobin 13.3 12.0 - 15.0 g/dL   HCT 30.8 65.7 - 84.6 %   MCV 90.0 80.0 - 100.0 fL   MCH 31.1 26.0 - 34.0 pg   MCHC 34.5 30.0 - 36.0 g/dL   RDW 96.2 95.2 - 84.1 %   Platelets 182 150 - 400 K/uL   nRBC 0.0 0.0 - 0.2 %  Type and screen     Status: None (Preliminary result)   Collection Time: 07/11/18  7:32 AM  Result Value Ref Range   ABO/RH(D) O NEG    Antibody Screen POS    Sample Expiration      07/14/2018 Performed at [facility], [address] [place], [place] [address]    Antibody Identification PENDING    50 mcg of misoprostol placed vaginally.  Assessment   G1P0000 at [redacted]w[redacted]d Estimated Date of Delivery: 07/18/18  The fetus is reassuring.  Pregnancy-induced hypertension without severe features  Patient Active Problem List   Diagnosis Date Noted  . Post-dates pregnancy 07/11/2018  . Gestational hypertension, third trimester 07/08/2018  . Labor and delivery indication for care or intervention 07/08/2018  . Kidney stone complicating pregnancy, third trimester 04/18/2018  . Kidney stone 04/18/2018  . Pregnancy 04/13/2018  . Rh negative state in antepartum  period 01/04/2018    Plan  1. Admit to L&D :    2. EFM: -- Category 1 3. Epidural if desired.  Stadol for IV pain until epidural requested. 4. Admission labs  5.  Induction of labor-expect vaginal delivery.  Elonda Husky, M.D. 07/11/2018 8:44 AM

## 2018-07-11 NOTE — Progress Notes (Signed)
Patient ID: Taylor Galvan, female   DOB: [birth year], 22 y.o.   MRN: [id number]      Delayed note from 1215  LABOR NOTE   Taylor Galvan 22 y.o.@ at [redacted]w[redacted]d Early latent labor.  SUBJECTIVE:  Comfortable with contractions OBJECTIVE:  BP 121/72   Pulse 81   Temp 98.4 F (36.9 C) (Oral)   Resp 16   Ht 5\' 8"  (1.727 m)   Wt 89.4 kg   LMP 10/11/2017   SpO2 97%   BMI 29.95 kg/m  No intake/output data recorded.  She has shown cervical change. CERVIX: 3 cm:  75%:   -3:    SVE:   Dilation: 3 Effacement (%): 100 Station: -1, 0 Exam by:: Laural Benes, RN CONTRACTIONS: irregular, with some coupling FHR: Fetal heart tracing reviewed. Variability: Good {> 6 bpm) Category I   Analgesia: Labor support without medications  Labs: Lab Results  Component Value Date   WBC 10.5 07/11/2018   HGB 13.3 07/11/2018   HCT 38.5 07/11/2018   MCV 90.0 07/11/2018   PLT 182 07/11/2018    AROM : copious clear fluid 50cmg Misoprostol placed in vagina  ASSESSMENT: 1) Labor curve reviewed.       Progress: Early latent labor.     Membranes: intact, ruptured       Active Problems:   Post-dates pregnancy   PLAN: Misoprostol placed.  Elonda Husky, M.D. 07/11/2018 4:40 PM      1215

## 2018-07-12 DIAGNOSIS — Z3A39 39 weeks gestation of pregnancy: Secondary | ICD-10-CM

## 2018-07-12 DIAGNOSIS — O134 Gestational [pregnancy-induced] hypertension without significant proteinuria, complicating childbirth: Secondary | ICD-10-CM

## 2018-07-12 LAB — TYPE AND SCREEN
ABO/RH(D): O NEG
Antibody Screen: POSITIVE
UNIT DIVISION: 0
Unit division: 0

## 2018-07-12 LAB — BPAM RBC
BLOOD PRODUCT EXPIRATION DATE: 201911132359
BLOOD PRODUCT EXPIRATION DATE: 201911132359
Unit Type and Rh: 9500
Unit Type and Rh: 9500

## 2018-07-12 LAB — RPR: RPR Ser Ql: NONREACTIVE

## 2018-07-12 MED ORDER — SIMETHICONE 80 MG PO CHEW: 80.0000 mg | CHEWABLE_TABLET | ORAL | Status: DC | PRN

## 2018-07-12 MED ORDER — TETANUS-DIPHTH-ACELL PERTUSSIS 5-2.5-18.5 LF-MCG/0.5 IM SUSP: 0.5000 mL | Freq: Once | INTRAMUSCULAR | Status: DC

## 2018-07-12 MED ORDER — DIPHENHYDRAMINE HCL 25 MG PO CAPS: 25.0000 mg | ORAL_CAPSULE | Freq: Four times a day (QID) | ORAL | Status: DC | PRN

## 2018-07-12 MED ORDER — PRENATAL MULTIVITAMIN CH
1.0000 | ORAL_TABLET | Freq: Every day | ORAL | Status: DC
  Administered 2018-07-13: 1 via ORAL
  Filled 2018-07-12 (×2): qty 1

## 2018-07-12 MED ORDER — ACETAMINOPHEN 325 MG PO TABS
650.0000 mg | ORAL_TABLET | ORAL | Status: DC | PRN
  Administered 2018-07-12: 650 mg via ORAL
  Filled 2018-07-12: qty 2

## 2018-07-12 MED ORDER — BENZOCAINE-MENTHOL 20-0.5 % EX AERO
1.0000 "application " | INHALATION_SPRAY | CUTANEOUS | Status: DC | PRN
  Administered 2018-07-12 – 2018-07-14 (×2): 1 via TOPICAL
  Filled 2018-07-12 (×2): qty 56

## 2018-07-12 MED ORDER — OXYCODONE-ACETAMINOPHEN 5-325 MG PO TABS: 1.0000 | ORAL_TABLET | ORAL | Status: DC | PRN

## 2018-07-12 MED ORDER — DOCUSATE SODIUM 100 MG PO CAPS
100.0000 mg | ORAL_CAPSULE | Freq: Two times a day (BID) | ORAL | Status: DC
  Administered 2018-07-12 – 2018-07-14 (×4): 100 mg via ORAL
  Filled 2018-07-12 (×4): qty 1

## 2018-07-12 MED ORDER — IBUPROFEN 600 MG PO TABS
600.0000 mg | ORAL_TABLET | Freq: Four times a day (QID) | ORAL | Status: DC
  Administered 2018-07-12 – 2018-07-14 (×9): 600 mg via ORAL
  Filled 2018-07-12 (×9): qty 1

## 2018-07-12 MED ORDER — OXYTOCIN 40 UNITS IN LACTATED RINGERS INFUSION - SIMPLE MED
2.5000 [IU]/h | INTRAVENOUS | Status: DC | PRN
  Administered 2018-07-12: 2.5 [IU]/h via INTRAVENOUS
  Filled 2018-07-12: qty 1000

## 2018-07-12 MED ORDER — DOCUSATE SODIUM 100 MG PO CAPS: 100.0000 mg | ORAL_CAPSULE | Freq: Two times a day (BID) | ORAL | Status: DC

## 2018-07-12 MED ORDER — ZOLPIDEM TARTRATE 5 MG PO TABS: 5.0000 mg | ORAL_TABLET | Freq: Every evening | ORAL | Status: DC | PRN

## 2018-07-12 NOTE — Progress Notes (Signed)
Patient ID: Taylor Galvan, female   DOB: 1996/07/22, 22 y.o.   MRN: [id number]  Progress Note - Vaginal Delivery  Taylor Galvan is a 22 y.o. G1P1001 now PP day 0 s/p Vaginal, Vacuum (Extractor) .   Subjective:  The patient reports up ad lib, voiding and tolerating PO Pt plans to pump Delivery discussed  Objective:  Vital signs in last 24 hours: Temp:  [98 F (36.7 C)-98.7 F (37.1 C)] 98 F (36.7 C) (10/11 0757) Pulse Rate:  [57-99] 57 (10/11 0913) Resp:  [18-20] 18 (10/11 0913) BP: (121-161)/(70-95) 142/88 (10/11 0913) SpO2:  [95 %-100 %] 100 % (10/11 0757)  Physical Exam:  General: alert, cooperative and no distress Lochia: appropriate Uterine Fundus: firm DVT Evaluation: No evidence of DVT seen on physical exam.    Data Review Recent Labs    07/11/18 0616  HGB 13.3  HCT 38.5    Assessment/Plan: Doing well s/p vac assist vaginal birth.    -- Continue routine PP care.     Elonda Husky, M.D. 07/12/2018 9:32 AM

## 2018-07-12 NOTE — Lactation Note (Signed)
This note was copied from a baby's chart. Lactation Consultation Note  Patient Name: Taylor Galvan [id number] Date: 07/12/2018 Reason for consult: Primapara;1st time breastfeeding;NICU baby Cephalohematoma, baby fussy when head is moved  Maternal Data Does the patient have breastfeeding experience prior to this delivery?: No  Feeding Feeding Type: Breast Fed Non nutritive suck occ. at breast, baby calm and sleepy LATCH Score Latch: Repeated attempts needed to sustain latch, nipple held in mouth throughout feeding, stimulation needed to elicit sucking reflex.(in SCN, first attempt, rooted and occ. suck )  Audible Swallowing: None  Type of Nipple: Everted at rest and after stimulation  Comfort (Breast/Nipple): Soft / non-tender  Hold (Positioning): Assistance needed to correctly position infant at breast and maintain latch.  LATCH Score: 6  Interventions Interventions: Assisted with latch;Skin to skin;Hand express;Breast compression;Adjust position;Support pillows;Position options  Lactation Tools Discussed/Used WIC Program: No   Consult Status Consult Status: Follow-up Date: 07/13/18 Follow-up type: In-patient  Focused on just having baby at breast, occ suck, baby roots and hunts for breast, calming for baby   Dyann Kief 07/12/2018, 3:55 PM

## 2018-07-13 LAB — FETAL SCREEN: FETAL SCREEN: NEGATIVE

## 2018-07-13 MED ORDER — RHO D IMMUNE GLOBULIN 1500 UNIT/2ML IJ SOSY
300.0000 ug | PREFILLED_SYRINGE | Freq: Once | INTRAMUSCULAR | Status: AC
  Administered 2018-07-13: 300 ug via INTRAMUSCULAR
  Filled 2018-07-13: qty 2

## 2018-07-13 MED ORDER — MAGNESIUM HYDROXIDE 400 MG/5ML PO SUSP
30.0000 mL | Freq: Once | ORAL | Status: AC
  Administered 2018-07-13: 30 mL via ORAL
  Filled 2018-07-13: qty 30

## 2018-07-13 NOTE — Progress Notes (Signed)
Post Partum Day # 1, s/p VAVD. History of gestational HTN in current pregnancy.   Subjective: no complaints, up ad lib, voiding and tolerating PO.  Objective: Temp:  [97.7 F (36.5 C)-98.6 F (37 C)] 98.1 F (36.7 C) (10/12 0816) Pulse Rate:  [66-90] 66 (10/12 0816) Resp:  [18-20] 18 (10/12 0816) BP: (131-140)/(82-88) 134/88 (10/12 0816) SpO2:  [97 %-100 %] 98 % (10/12 0816)  Physical Exam:  General: alert and no distress  Lungs: clear to auscultation bilaterally Breasts: normal appearance, no masses or tenderness Heart: regular rate and rhythm, S1, S2 normal, no murmur, click, rub or gallop Abdomen: soft, non-tender; bowel sounds normal; no masses,  no organomegaly Pelvis: Lochia: appropriate, Uterine Fundus: firm Extremities: DVT Evaluation: No evidence of DVT seen on physical exam. Negative Homan's sign. No cords or calf tenderness. No significant calf/ankle edema.  Recent Labs    07/11/18 0616  HGB 13.3  HCT 38.5    Assessment/Plan: Overall doing well postpartum Breastfeeding, Lactation consult, and formula feeding as infant is currently in special care nursery.  Circumcision prior to discharge  Contraception combined OCPs.  Gestational HTN, BPs currently wnl.  Continue to monitor. No medications needed at this time.  Rh negative. For rhogam prior to discharge.  Likely d/c home tomorrow. If infant not ready for discharge, can room-in.    LOS: 2 days   Hildred Laser, MD [facility] 07/13/2018 10:47 AM

## 2018-07-13 NOTE — Anesthesia Postprocedure Evaluation (Signed)
Anesthesia Post Note  Patient: Taylor Galvan  Procedure(s) Performed: AN AD HOC LABOR EPIDURAL  Patient location during evaluation: Mother Baby Anesthesia Type: Epidural Level of consciousness: awake and alert and oriented Pain management: pain level controlled Vital Signs Assessment: post-procedure vital signs reviewed and stable Respiratory status: spontaneous breathing Cardiovascular status: blood pressure returned to baseline Anesthetic complications: no     Last Vitals:  Vitals:   07/13/18 0502 07/13/18 0816  BP: 131/82 134/88  Pulse: 67 66  Resp: 20 18  Temp: 36.7 C 36.7 C  SpO2: 97% 98%    Last Pain:  Vitals:   07/13/18 1008  TempSrc:   PainSc: 0-No pain                 Hamna Asa

## 2018-07-13 NOTE — Lactation Note (Signed)
This note was copied from a baby's chart. Lactation Consultation Note  Patient Name: Taylor Galvan [id number] Date: 07/13/2018 Reason for consult: Follow-up assessment;Primapara;Term  Assisted mom with latching River to left breast with cloth diaper under head.  He latches after a few attempts where he was fussy.  He started slowing down his sucks and swallows after 20 minutes, but remained on the breast for a few more intermittent sucks. Maternal Data Formula Feeding for Exclusion: No Has patient been taught Hand Expression?: Yes Does the patient have breastfeeding experience prior to this delivery?: No  Feeding Feeding Type: Breast Fed  LATCH Score Latch: Repeated attempts needed to sustain latch, nipple held in mouth throughout feeding, stimulation needed to elicit sucking reflex.  Audible Swallowing: Spontaneous and intermittent  Type of Nipple: Everted at rest and after stimulation  Comfort (Breast/Nipple): Filling, red/small blisters or bruises, mild/mod discomfort  Hold (Positioning): Assistance needed to correctly position infant at breast and maintain latch.  LATCH Score: 7  Interventions Interventions: Assisted with latch;Skin to skin;Position options;Reverse pressure;Expressed milk;Breast compression;Breast massage;Adjust position;Hand express;Support pillows  Lactation Tools Discussed/Used WIC Program: No([employer]) Pump Review: Setup, frequency, and cleaning;Milk Storage;Other (comment) Initiated by:: [name], RN, BSN Date initiated:: 07/12/18   Consult Status Consult Status: Follow-up Follow-up type: Call as needed    [name] 07/13/2018, 5:40 PM

## 2018-07-13 NOTE — Plan of Care (Signed)
Patient's vital signs stable; fundus firm; small amount rubra lochia; voiding; good appetite; good po fluids; pumping breasts and breast fed infant once while infant remains in SCN; patient and her husband visit infant in SCN for bonding; pain controlled with po motrin; patient sleeping at long intervals tonight; husband asleep in recliner at bedside.

## 2018-07-13 NOTE — Lactation Note (Signed)
This note was copied from a baby's chart. Lactation Consultation Note  Patient Name: Taylor Galvan [id number] Date: 07/13/2018 Reason for consult: Follow-up assessment;NICU baby   Maternal Data Formula Feeding for Exclusion: No Has patient been taught Hand Expression?: Yes Does the patient have breastfeeding experience prior to this delivery?: No  Feeding Feeding Type: Breast Fed  LATCH Score Latch: Grasps breast easily, tongue down, lips flanged, rhythmical sucking.  Audible Swallowing: Spontaneous and intermittent  Type of Nipple: Everted at rest and after stimulation  Comfort (Breast/Nipple): Filling, red/small blisters or bruises, mild/mod discomfort  Hold (Positioning): Assistance needed to correctly position infant at breast and maintain latch.  LATCH Score: 8  Interventions    Lactation Tools Discussed/Used     Consult Status Consult Status: Follow-up Follow-up type: Call as needed    [name] 07/13/2018, 3:00 PM

## 2018-07-13 NOTE — Lactation Note (Signed)
This note was copied from a baby's chart. Lactation Consultation Note  Patient Name: Taylor Galvan [id number] Date: 07/13/2018 Reason for consult: Follow-up assessment;Primapara;Other (Comment);NICU baby(Hypoglycemia) Assisted mom with positioning for comfort with pillow support in cradle position on left breast.  Can easily hand express.  Mom reports leaking since 6 months.  At first River was fussy.  After a few attempts, he latched and began strong rhythmic sucking with frequent swallows shortly into breast feed.  Only occasional stimulation and breast massage was required to keep him nutritively sucking at the breast.  Mom seemed very relaxed and comfortable for entire breast feed.  Put him skin to skin with mom for short period.  Then FOB held while mom pumped right breast and got 15 ml transitional breast milk.  Reviewed supply and demand, normal course of lactation and routine newborn feeding pattern.  Praised mom for her commitment to continue to breast feed her baby.    Maternal Data Formula Feeding for Exclusion: No Has patient been taught Hand Expression?: Yes Does the patient have breastfeeding experience prior to this delivery?: No  Feeding Feeding Type: Breast Fed  LATCH Score Latch: Repeated attempts needed to sustain latch, nipple held in mouth throughout feeding, stimulation needed to elicit sucking reflex.  Audible Swallowing: A few with stimulation  Type of Nipple: Everted at rest and after stimulation  Comfort (Breast/Nipple): Soft / non-tender  Hold (Positioning): Assistance needed to correctly position infant at breast and maintain latch.  LATCH Score: 7  Interventions Interventions: Breast feeding basics reviewed;Position options;Assisted with latch;Reverse pressure;Expressed milk;DEBP;Skin to skin;Breast compression;Breast massage;Adjust position;Hand express;Support pillows  Lactation Tools Discussed/Used WIC Program: No Pump Review: Setup, frequency, and  cleaning;Milk Storage;Other (comment) Initiated by:: [name], RN,BSN Date initiated:: 07/12/18   Consult Status Consult Status: Follow-up Date: 07/13/18 Follow-up type: Call as needed    [name] 07/13/2018, 2:52 PM

## 2018-07-14 LAB — RHOGAM INJECTION: UNIT DIVISION: 0

## 2018-07-14 MED ORDER — DOCUSATE SODIUM 100 MG PO CAPS: 100.0000 mg | ORAL_CAPSULE | Freq: Two times a day (BID) | ORAL | 2 refills | Status: DC | PRN

## 2018-07-14 MED ORDER — IBUPROFEN 800 MG PO TABS: 800.0000 mg | ORAL_TABLET | Freq: Three times a day (TID) | ORAL | 1 refills | Status: DC | PRN

## 2018-07-14 NOTE — Progress Notes (Signed)
Patient discharged home with husband. Discharge instructions and prescriptions given and reviewed with patient. Patient verbalized understanding.

## 2018-07-14 NOTE — Discharge Summary (Signed)
OB Discharge Summary     Patient Name: Taylor Galvan DOB: [birth year] MRN: [id number]  Date of admission: 07/11/2018 Delivering MD: Linzie Collin   Date of discharge: 07/14/2018  Admitting diagnosis: Gestational Hypertension Intrauterine pregnancy: [redacted]w[redacted]d     Secondary diagnosis:  Active Problems: Rh negative status  Additional problems: None     Discharge diagnosis: Term Pregnancy Delivered and Gestational Hypertension                                                                                                Post partum procedures:rhogam  Augmentation: AROM, Pitocin and Cytotec  Complications: None  Hospital course:  Induction of Labor With Vaginal Delivery   22 y.o. yo G1P1001 at [redacted]w[redacted]d was admitted to the hospital 07/11/2018 for induction of labor.  Indication for induction: Gestational hypertension.  Patient had an uncomplicated labor course as follows: Membrane Rupture Time/Date: 12:13 PM ,07/11/2018   Intrapartum Procedures: Episiotomy: Median [2]                                         Lacerations:  None [1]  Patient had delivery of a Viable infant.  Information for the patient's newborn:  Lorella, Gomez [[id number]]  Delivery Method: Vaginal, Vacuum (Extractor)(Filed from Delivery Summary)   07/12/2018  Details of delivery can be found in separate delivery note.  Patient had a routine postpartum course. Patient is discharged home 07/14/18.  Physical exam  Vitals:   07/13/18 0816 07/13/18 2000 07/14/18 0033 07/14/18 0749  BP: 134/88 139/83 126/80 137/87  Pulse: 66 72 80 62  Resp: 18 20 20 18   Temp: 98.1 F (36.7 C) 98.2 F (36.8 C) 98.1 F (36.7 C) 98.4 F (36.9 C)  TempSrc: Oral Oral Oral   SpO2: 98% 100% 99% 98%  Weight:      Height:       General: alert and no distress Lochia: appropriate Uterine Fundus: firm Incision: N/A DVT Evaluation: No evidence of DVT seen on physical exam. Negative Homan's sign. No cords or calf  tenderness. No significant calf/ankle edema. Labs: Lab Results  Component Value Date   WBC 10.5 07/11/2018   HGB 13.3 07/11/2018   HCT 38.5 07/11/2018   MCV 90.0 07/11/2018   PLT 182 07/11/2018   CMP Latest Ref Rng & Units 07/08/2018  Glucose 70 - 99 mg/dL 74  BUN 6 - 20 mg/dL 6  Creatinine 7.82 - 9.56 mg/dL 2.13  Sodium 086 - 578 mmol/L 138  Potassium 3.5 - 5.1 mmol/L 3.6  Chloride 98 - 111 mmol/L 108  CO2 22 - 32 mmol/L 22  Calcium 8.9 - 10.3 mg/dL 4.6(N)  Total Protein 6.5 - 8.1 g/dL 6.3(L)  Total Bilirubin 0.3 - 1.2 mg/dL 0.7  Alkaline Phos 38 - 126 U/L 104  AST 15 - 41 U/L 18  ALT 0 - 44 U/L 13    Discharge instruction: per After Visit Summary and "Baby and Me Booklet".  After visit meds:  Allergies as of  07/14/2018   No Known Allergies     Medication List    STOP taking these medications   acetaminophen 500 MG tablet Commonly known as:  TYLENOL   oxyCODONE-acetaminophen 5-325 MG tablet Commonly known as:  PERCOCET/ROXICET     TAKE these medications   CITRANATAL BLOOM 90-1 MG Tabs Take 90 mg by mouth daily.   docusate sodium 100 MG capsule Commonly known as:  COLACE Take 1 capsule (100 mg total) by mouth 2 (two) times daily as needed.   ibuprofen 800 MG tablet Commonly known as:  ADVIL,MOTRIN Take 1 tablet (800 mg total) by mouth every 8 (eight) hours as needed.       Diet: routine diet  Activity: Advance as tolerated. Pelvic rest for 6 weeks.   Outpatient follow up:6 weeks for postpartum visit, 3-4 days for BP check.  Follow up Appt: Future Appointments  Date Time Provider Department Center  08/22/2018  1:45 PM Linzie Collin, MD [facility] None   Follow up Visit:No follow-ups on file.  Postpartum contraception: Combination OCPs  Newborn Data: Live born female  Birth Weight:   APGAR: 2, 5  Newborn Delivery   Time head delivered:  07/12/2018 01:43:00 Birth date/time:  07/12/2018 01:44:00 Delivery type:  Vaginal, Vacuum (Extractor)      Baby Feeding: Bottle and Breast Disposition:NICU   07/14/2018 Hildred Laser, MD  [facility]

## 2018-07-15 ENCOUNTER — Encounter: Payer: 59 | Admitting: Obstetrics and Gynecology

## 2018-07-19 ENCOUNTER — Ambulatory Visit (INDEPENDENT_AMBULATORY_CARE_PROVIDER_SITE_OTHER): Payer: 59 | Admitting: Obstetrics and Gynecology

## 2018-07-19 VITALS — BP 161/97 | HR 69 | Ht 68.0 in | Wt 180.3 lb

## 2018-07-19 DIAGNOSIS — O133 Gestational [pregnancy-induced] hypertension without significant proteinuria, third trimester: Secondary | ICD-10-CM

## 2018-07-19 LAB — POCT URINALYSIS DIPSTICK
Bilirubin, UA: NEGATIVE
GLUCOSE UA: NEGATIVE
Ketones, UA: NEGATIVE
Leukocytes, UA: NEGATIVE
Nitrite, UA: NEGATIVE
ODOR: NEGATIVE
PROTEIN UA: NEGATIVE
SPEC GRAV UA: 1.01 (ref 1.010–1.025)
Urobilinogen, UA: 0.2 E.U./dL
pH, UA: 8 (ref 5.0–8.0)

## 2018-07-19 NOTE — Patient Instructions (Signed)

## 2018-07-19 NOTE — Progress Notes (Signed)
Pt presents for bp check only. Pt is s/p svd on 07/12/2018. Induction for gestational HTN. Bp elevated- 151/91,  161/91. Pt denies headaches, blurred vision, or dizziness. Urine negative for protein. Consult with DJE via phone. S/S of elevated bp reviewed. Pt is to f/u with him in 1 week.

## 2018-07-26 ENCOUNTER — Ambulatory Visit (INDEPENDENT_AMBULATORY_CARE_PROVIDER_SITE_OTHER): Payer: 59 | Admitting: Obstetrics and Gynecology

## 2018-07-26 ENCOUNTER — Encounter: Payer: Self-pay | Admitting: Obstetrics and Gynecology

## 2018-07-26 VITALS — BP 158/78 | HR 80 | Ht 68.0 in | Wt 180.0 lb

## 2018-07-26 DIAGNOSIS — O139 Gestational [pregnancy-induced] hypertension without significant proteinuria, unspecified trimester: Secondary | ICD-10-CM

## 2018-07-26 NOTE — Progress Notes (Signed)
HPI:      [name] is a 22 y.o. G1P1001 who LMP was No LMP recorded.  Subjective:   She presents today for a postpartum check and a blood pressure check.  She reports that she and the baby are doing well postpartum.  She reports no problems.  She has stopped bleeding.  She denies headache or abdominal pain.  She reports that she has stopped breast-feeding and is bottlefeeding.    Hx: The following portions of the patient's history were reviewed and updated as appropriate:             She  has a past medical history of Kidney stone. She does not have any pertinent problems on file. She  has a past surgical history that includes No past surgeries. Her family history includes Hypertension in her maternal grandmother and mother; Kidney Stones in her maternal grandfather and mother. She  reports that she has quit smoking. She has quit using smokeless tobacco. She reports that she does not drink alcohol or use drugs. She has a current medication list which includes the following prescription(s): docusate sodium and citranatal bloom. She has No Known Allergies.       Review of Systems:  Review of Systems  Constitutional: Denied constitutional symptoms, night sweats, recent illness, fatigue, fever, insomnia and weight loss.  Eyes: Denied eye symptoms, eye pain, photophobia, vision change and visual disturbance.  Ears/Nose/Throat/Neck: Denied ear, nose, throat or neck symptoms, hearing loss, nasal discharge, sinus congestion and sore throat.  Cardiovascular: Denied cardiovascular symptoms, arrhythmia, chest pain/pressure, edema, exercise intolerance, orthopnea and palpitations.  Respiratory: Denied pulmonary symptoms, asthma, pleuritic pain, productive sputum, cough, dyspnea and wheezing.  Gastrointestinal: Denied, gastro-esophageal reflux, melena, nausea and vomiting.  Genitourinary: Denied genitourinary symptoms including symptomatic vaginal discharge, pelvic relaxation issues, and  urinary complaints.  Musculoskeletal: Denied musculoskeletal symptoms, stiffness, swelling, muscle weakness and myalgia.  Dermatologic: Denied dermatology symptoms, rash and scar.  Neurologic: Denied neurology symptoms, dizziness, headache, neck pain and syncope.  Psychiatric: Denied psychiatric symptoms, anxiety and depression.  Endocrine: Denied endocrine symptoms including hot flashes and night sweats.   Meds:   Current Outpatient Medications on File Prior to Visit  Medication Sig Dispense Refill  . docusate sodium (COLACE) 100 MG capsule Take 1 capsule (100 mg total) by mouth 2 (two) times daily as needed. 30 capsule 2  . Prenatal-DSS-FeCb-FeGl-FA (CITRANATAL BLOOM) 90-1 MG TABS Take 90 mg by mouth daily. 30 tablet 11   No current facility-administered medications on file prior to visit.     Objective:     Vitals:   07/26/18 1035  BP: (!) 158/78  Pulse: 80              Manual blood pressure taken by me 132/84.  Assessment:    G1P1001 Patient Active Problem List   Diagnosis Date Noted  . Post-dates pregnancy 07/11/2018  . Gestational hypertension, third trimester 07/08/2018  . Labor and delivery indication for care or intervention 07/08/2018  . Kidney stone complicating pregnancy, third trimester 04/18/2018  . Kidney stone 04/18/2018  . Pregnancy 04/13/2018  . Rh negative state in antepartum period 01/04/2018     1. Gestational hypertension, antepartum     Patient doing well does not currently have hypertension.   Plan:            1.  Follow-up for postpartum visit as previously scheduled. Orders No orders of the defined types were placed in this encounter.   No orders of  the defined types were placed in this encounter.     F/U  No follow-ups on file.  Taylor Galvan, M.D. 07/26/2018 11:13 AM

## 2018-07-26 NOTE — Progress Notes (Signed)
Pt presents today for BP check. 

## 2018-08-22 ENCOUNTER — Ambulatory Visit (INDEPENDENT_AMBULATORY_CARE_PROVIDER_SITE_OTHER): Payer: 59 | Admitting: Obstetrics and Gynecology

## 2018-08-22 ENCOUNTER — Encounter: Payer: 59 | Admitting: Obstetrics and Gynecology

## 2018-08-22 ENCOUNTER — Encounter: Payer: Self-pay | Admitting: Obstetrics and Gynecology

## 2018-08-22 DIAGNOSIS — Z30011 Encounter for initial prescription of contraceptive pills: Secondary | ICD-10-CM

## 2018-08-22 MED ORDER — NORETHIN-ETH ESTRAD-FE BIPHAS 1 MG-10 MCG / 10 MCG PO TABS: 1.0000 | ORAL_TABLET | Freq: Every day | ORAL | 3 refills | Status: DC

## 2018-08-22 NOTE — Progress Notes (Signed)
Pt presents today for PPV. Pt states she is doing well and has no concerns. [facility] PHQ-9 score 0

## 2018-08-22 NOTE — Progress Notes (Signed)
HPI:      Taylor Galvan is a 22 y.o. G1P1001 who LMP was No LMP recorded.  Subjective:   She presents today for her postpartum visit.  She has no complaints.  She has resumed intercourse and denies any problems.  She would like OCPs for birth control.  She is currently bottlefeeding.  She reports her son is doing well.    Hx: The following portions of the patient's history were reviewed and updated as appropriate:             She  has a past medical history of Kidney stone. She does not have any pertinent problems on file. She  has a past surgical history that includes No past surgeries. Her family history includes Hypertension in her maternal grandmother and mother; Kidney Stones in her maternal grandfather and mother. She  reports that she has quit smoking. She has quit using smokeless tobacco. She reports that she does not drink alcohol or use drugs. She has a current medication list which includes the following prescription(s): norethindrone-ethinyl estradiol-fe biphas. She has No Known Allergies.       Review of Systems:  Review of Systems  Constitutional: Denied constitutional symptoms, night sweats, recent illness, fatigue, fever, insomnia and weight loss.  Eyes: Denied eye symptoms, eye pain, photophobia, vision change and visual disturbance.  Ears/Nose/Throat/Neck: Denied ear, nose, throat or neck symptoms, hearing loss, nasal discharge, sinus congestion and sore throat.  Cardiovascular: Denied cardiovascular symptoms, arrhythmia, chest pain/pressure, edema, exercise intolerance, orthopnea and palpitations.  Respiratory: Denied pulmonary symptoms, asthma, pleuritic pain, productive sputum, cough, dyspnea and wheezing.  Gastrointestinal: Denied, gastro-esophageal reflux, melena, nausea and vomiting.  Genitourinary: Denied genitourinary symptoms including symptomatic vaginal discharge, pelvic relaxation issues, and urinary complaints.  Musculoskeletal: Denied musculoskeletal  symptoms, stiffness, swelling, muscle weakness and myalgia.  Dermatologic: Denied dermatology symptoms, rash and scar.  Neurologic: Denied neurology symptoms, dizziness, headache, neck pain and syncope.  Psychiatric: Denied psychiatric symptoms, anxiety and depression.  Endocrine: Denied endocrine symptoms including hot flashes and night sweats.   Meds:   No current outpatient medications on file prior to visit.   No current facility-administered medications on file prior to visit.     Objective:     Vitals:   08/22/18 1050  BP: (!) 146/82  Pulse: 71            Pelvic examination   Pelvic:   Vulva: Normal appearance.  No lesions.  No abnormal scarring.    Vagina: No lesions or abnormalities noted.  Support: Normal pelvic support.  Urethra No masses tenderness or scarring.  Meatus Normal size without lesions or prolapse.  Cervix: Normal ectropion.  No lesions.  Anus: Normal exam.  No lesions.  Perineum: Normal exam.  No lesions.  Healed well.          Bimanual   Uterus: Normal size.  Non-tender.  Mobile.  AV.  Adnexae: No masses.  Non-tender to palpation.  Cul-de-sac: Negative for abnormality.      Assessment:    G1P1001 Patient Active Problem List   Diagnosis Date Noted  . Post-dates pregnancy 07/11/2018  . Gestational hypertension, third trimester 07/08/2018  . Labor and delivery indication for care or intervention 07/08/2018  . Kidney stone complicating pregnancy, third trimester 04/18/2018  . Kidney stone 04/18/2018  . Pregnancy 04/13/2018  . Rh negative state in antepartum period 01/04/2018     1. Postpartum care and examination immediately after delivery   2. Initiation of OCP (BCP)  Patient desires monthly cyclic OCPs.   Plan:            1.  Patient may resume normal activities  2. OCPs The risks /benefits of OCPs have been explained to the patient in detail.  Product literature has been given to her.  I have instructed her in the use of OCPs  and have given her literature reinforcing this information.  I have explained to the patient that OCPs are not as effective for birth control during the first month of use, and that another form of contraception should be used during this time.  Both first-day start and Sunday start have been explained.  The risks and benefits of each was discussed.  She has been made aware of  the fact that other medications may affect the efficacy of OCPs.  I have answered all of her questions, and I believe that she has an understanding of the effectiveness and use of OCPs.  Orders No orders of the defined types were placed in this encounter.    Meds ordered this encounter  Medications  . Norethindrone-Ethinyl Estradiol-Fe Biphas (LO LOESTRIN FE) 1 MG-10 MCG / 10 MCG tablet    Sig: Take 1 tablet by mouth at bedtime for 28 days.    Dispense:  1 Package    Refill:  3      F/U  Return in about 4 months (around 12/21/2018) for Annual Physical.  Elonda Huskyavid J. , M.D. 08/22/2018 11:14 AM

## 2018-12-10 ENCOUNTER — Other Ambulatory Visit: Payer: Self-pay | Admitting: Obstetrics and Gynecology

## 2018-12-10 DIAGNOSIS — Z30011 Encounter for initial prescription of contraceptive pills: Secondary | ICD-10-CM

## 2018-12-24 ENCOUNTER — Encounter: Payer: 59 | Admitting: Obstetrics and Gynecology

## 2019-01-04 ENCOUNTER — Other Ambulatory Visit: Payer: Self-pay | Admitting: Obstetrics and Gynecology

## 2019-01-04 DIAGNOSIS — Z30011 Encounter for initial prescription of contraceptive pills: Secondary | ICD-10-CM

## 2019-02-02 ENCOUNTER — Other Ambulatory Visit: Payer: Self-pay | Admitting: Obstetrics and Gynecology

## 2019-02-02 DIAGNOSIS — Z30011 Encounter for initial prescription of contraceptive pills: Secondary | ICD-10-CM

## 2019-02-18 ENCOUNTER — Encounter: Payer: 59 | Admitting: Obstetrics and Gynecology

## 2019-02-27 ENCOUNTER — Other Ambulatory Visit: Payer: Self-pay | Admitting: Obstetrics and Gynecology

## 2019-02-27 DIAGNOSIS — Z30011 Encounter for initial prescription of contraceptive pills: Secondary | ICD-10-CM

## 2019-03-31 ENCOUNTER — Other Ambulatory Visit: Payer: Self-pay | Admitting: Obstetrics and Gynecology

## 2019-03-31 DIAGNOSIS — Z30011 Encounter for initial prescription of contraceptive pills: Secondary | ICD-10-CM

## 2019-04-28 ENCOUNTER — Other Ambulatory Visit: Payer: Self-pay | Admitting: Obstetrics and Gynecology

## 2019-04-28 DIAGNOSIS — Z30011 Encounter for initial prescription of contraceptive pills: Secondary | ICD-10-CM

## 2019-04-29 ENCOUNTER — Encounter: Payer: 59 | Admitting: Obstetrics and Gynecology

## 2019-05-13 ENCOUNTER — Encounter: Payer: Self-pay | Admitting: Obstetrics and Gynecology

## 2019-06-03 ENCOUNTER — Ambulatory Visit (INDEPENDENT_AMBULATORY_CARE_PROVIDER_SITE_OTHER): Payer: 59 | Admitting: Obstetrics and Gynecology

## 2019-06-03 ENCOUNTER — Other Ambulatory Visit: Payer: Self-pay

## 2019-06-03 ENCOUNTER — Encounter: Payer: Self-pay | Admitting: Obstetrics and Gynecology

## 2019-06-03 VITALS — BP 147/87 | HR 72 | Ht 68.0 in | Wt 181.9 lb

## 2019-06-03 DIAGNOSIS — F419 Anxiety disorder, unspecified: Secondary | ICD-10-CM | POA: Diagnosis not present

## 2019-06-03 DIAGNOSIS — Z01419 Encounter for gynecological examination (general) (routine) without abnormal findings: Secondary | ICD-10-CM | POA: Diagnosis not present

## 2019-06-03 DIAGNOSIS — Z30011 Encounter for initial prescription of contraceptive pills: Secondary | ICD-10-CM

## 2019-06-03 MED ORDER — LO LOESTRIN FE 1 MG-10 MCG / 10 MCG PO TABS: ORAL_TABLET | ORAL | 3 refills | Status: DC

## 2019-06-03 NOTE — Progress Notes (Signed)
Patient comes in today for annual exam. She is not due for anything. Patient stated that she is having some anxiety. GAD 7 was a 9 today.

## 2019-06-03 NOTE — Progress Notes (Signed)
HPI:      [name] is a 23 y.o. G1P1001 who LMP was Patient's last menstrual period was 05/27/2019.  Subjective:   She presents today for her annual examination.  She is taking OCPs and would like to continue.  She states that her anxiety is getting worse slowly.  She says it is starting to affect some of her daily activities.  She describes how she has had problems with anxiety her whole life.  She has had previous counseling for this and did not find it "very helpful."  She is willing to see a counselor again. She reports her baby is doing fine and was happy to show me pictures.    Hx: The following portions of the patient's history were reviewed and updated as appropriate:             She  has a past medical history of Kidney stone. She does not have any pertinent problems on file. She  has a past surgical history that includes No past surgeries. Her family history includes Hypertension in her maternal grandmother and mother; Kidney Stones in her maternal grandfather and mother. She  reports that she has quit smoking. She has quit using smokeless tobacco. She reports that she does not drink alcohol or use drugs. She has a current medication list which includes the following prescription(s): lo loestrin fe. She has No Known Allergies.       Review of Systems:  Review of Systems  Constitutional: Denied constitutional symptoms, night sweats, recent illness, fatigue, fever, insomnia and weight loss.  Eyes: Denied eye symptoms, eye pain, photophobia, vision change and visual disturbance.  Ears/Nose/Throat/Neck: Denied ear, nose, throat or neck symptoms, hearing loss, nasal discharge, sinus congestion and sore throat.  Cardiovascular: Denied cardiovascular symptoms, arrhythmia, chest pain/pressure, edema, exercise intolerance, orthopnea and palpitations.  Respiratory: Denied pulmonary symptoms, asthma, pleuritic pain, productive sputum, cough, dyspnea and wheezing.   Gastrointestinal: Denied, gastro-esophageal reflux, melena, nausea and vomiting.  Genitourinary: Denied genitourinary symptoms including symptomatic vaginal discharge, pelvic relaxation issues, and urinary complaints.  Musculoskeletal: Denied musculoskeletal symptoms, stiffness, swelling, muscle weakness and myalgia.  Dermatologic: Denied dermatology symptoms, rash and scar.  Neurologic: Denied neurology symptoms, dizziness, headache, neck pain and syncope.  Psychiatric: See HPI for additional information.  Endocrine: Denied endocrine symptoms including hot flashes and night sweats.   Meds:   No current outpatient medications on file prior to visit.   No current facility-administered medications on file prior to visit.     Objective:     Vitals:   06/03/19 1502  BP: (!) 147/87  Pulse: 72              Physical examination General NAD, Conversant  HEENT Atraumatic; Op clear with mmm.  Normo-cephalic. Pupils reactive. Anicteric sclerae  Thyroid/Neck Smooth without nodularity or enlargement. Normal ROM.  Neck Supple.  Skin No rashes, lesions or ulceration. Normal palpated skin turgor. No nodularity.  Breasts: No masses or discharge.  Symmetric.  No axillary adenopathy.  Lungs: Clear to auscultation.No rales or wheezes. Normal Respiratory effort, no retractions.  Heart: NSR.  No murmurs or rubs appreciated. No periferal edema  Abdomen: Soft.  Non-tender.  No masses.  No HSM. No hernia  Extremities: Moves all appropriately.  Normal ROM for age. No lymphadenopathy.  Neuro: Oriented to PPT.  Normal mood. Normal affect.     Pelvic:   Vulva: Normal appearance.  No lesions.  Vagina: No lesions or abnormalities noted.  Support: Normal pelvic  support.  Urethra No masses tenderness or scarring.  Meatus Normal size without lesions or prolapse.  Cervix: Normal appearance.  No lesions.  Anus: Normal exam.  No lesions.  Perineum: Normal exam.  No lesions.        Bimanual   Uterus:  Normal size.  Non-tender.  Mobile.  AV.  Adnexae: No masses.  Non-tender to palpation.  Cul-de-sac: Negative for abnormality.     Assessment:    G1P1001 Patient Active Problem List   Diagnosis Date Noted  . Post-dates pregnancy 07/11/2018  . Gestational hypertension, third trimester 07/08/2018  . Labor and delivery indication for care or intervention 07/08/2018  . Kidney stone complicating pregnancy, third trimester 04/18/2018  . Kidney stone 04/18/2018  . Pregnancy 04/13/2018  . Rh negative state in antepartum period 01/04/2018     1. Well woman exam with routine gynecological exam   2. Anxiety   3. Postpartum care and examination immediately after delivery   4. Initiation of OCP (BCP)        Plan:            1.  Basic Screening Recommendations The basic screening recommendations for asymptomatic women were discussed with the patient during her visit.  The age-appropriate recommendations were discussed with her and the rational for the tests reviewed.  When I am informed by the patient that another primary care physician has previously obtained the age-appropriate tests and they are up-to-date, only outstanding tests are ordered and referrals given as necessary.  Abnormal results of tests will be discussed with her when all of her results are completed. 2.  We discussed anxiety in detail.  Multiple strategies discussed.  I have referred her to psychology counseling. 3.  Continue OCPs Orders Orders Placed This Encounter  Procedures  . Ambulatory referral to Psychology     Meds ordered this encounter  Medications  . Norethindrone-Ethinyl Estradiol-Fe Biphas (LO LOESTRIN FE) 1 MG-10 MCG / 10 MCG tablet    Sig: TAKE 1 TABLET BY MOUTH AT BEDTIME FOR 28 DAYS.    Dispense:  3 Package    Refill:  3        F/U  Return in about 1 year (around 06/02/2020) for Annual Physical.  Elonda Huskyavid J. Evans, M.D. 06/03/2019 4:43 PM

## 2019-07-08 ENCOUNTER — Ambulatory Visit (INDEPENDENT_AMBULATORY_CARE_PROVIDER_SITE_OTHER): Payer: 59 | Admitting: Licensed Clinical Social Worker

## 2019-07-08 DIAGNOSIS — F411 Generalized anxiety disorder: Secondary | ICD-10-CM | POA: Diagnosis not present

## 2019-08-05 ENCOUNTER — Ambulatory Visit (INDEPENDENT_AMBULATORY_CARE_PROVIDER_SITE_OTHER): Payer: 59 | Admitting: Licensed Clinical Social Worker

## 2019-08-05 DIAGNOSIS — F411 Generalized anxiety disorder: Secondary | ICD-10-CM | POA: Diagnosis not present

## 2019-09-02 ENCOUNTER — Ambulatory Visit: Payer: 59 | Admitting: Licensed Clinical Social Worker

## 2019-11-27 ENCOUNTER — Ambulatory Visit: Payer: 59 | Admitting: Obstetrics and Gynecology

## 2019-11-27 ENCOUNTER — Encounter: Payer: Self-pay | Admitting: Obstetrics and Gynecology

## 2019-11-27 ENCOUNTER — Other Ambulatory Visit: Payer: Self-pay

## 2019-11-27 VITALS — BP 151/91 | HR 83 | Ht 68.0 in | Wt 186.0 lb

## 2019-11-27 DIAGNOSIS — B029 Zoster without complications: Secondary | ICD-10-CM | POA: Diagnosis not present

## 2019-11-27 NOTE — Progress Notes (Signed)
HPI:      [name] is a 24 y.o. G1P1001 who LMP was Patient's last menstrual period was 11/06/2019.  Subjective:   She presents today after being diagnosed with a televisit of having shingles.  She was given Valtrex and she reports that it is significantly better.  She does report that it was never significantly painful.  It appears on the back of her right thigh. She does describe having chickenpox as a child. Because she wanted to make sure that it indeed was shingles and had a number of questions regarding future occurrences and ability to spread the virus she presented for an appointment.   Hx: The following portions of the patient's history were reviewed and updated as appropriate:             She  has a past medical history of Kidney stone. She does not have any pertinent problems on file. She  has a past surgical history that includes No past surgeries. Her family history includes Hypertension in her maternal grandmother and mother; Kidney Stones in her maternal grandfather and mother. She  reports that she has quit smoking. She has quit using smokeless tobacco. She reports that she does not drink alcohol or use drugs. She has a current medication list which includes the following prescription(s): lo loestrin fe, doxycycline, and valacyclovir. She has No Known Allergies.       Review of Systems:  Review of Systems  Constitutional: Denied constitutional symptoms, night sweats, recent illness, fatigue, fever, insomnia and weight loss.  Eyes: Denied eye symptoms, eye pain, photophobia, vision change and visual disturbance.  Ears/Nose/Throat/Neck: Denied ear, nose, throat or neck symptoms, hearing loss, nasal discharge, sinus congestion and sore throat.  Cardiovascular: Denied cardiovascular symptoms, arrhythmia, chest pain/pressure, edema, exercise intolerance, orthopnea and palpitations.  Respiratory: Denied pulmonary symptoms, asthma, pleuritic pain, productive sputum,  cough, dyspnea and wheezing.  Gastrointestinal: Denied, gastro-esophageal reflux, melena, nausea and vomiting.  Genitourinary: Denied genitourinary symptoms including symptomatic vaginal discharge, pelvic relaxation issues, and urinary complaints.  Musculoskeletal: Denied musculoskeletal symptoms, stiffness, swelling, muscle weakness and myalgia.  Dermatologic: Denied dermatology symptoms, rash and scar.  Neurologic: Denied neurology symptoms, dizziness, headache, neck pain and syncope.  Psychiatric: Denied psychiatric symptoms, anxiety and depression.  Endocrine: Denied endocrine symptoms including hot flashes and night sweats.   Meds:   Current Outpatient Medications on File Prior to Visit  Medication Sig Dispense Refill  . Norethindrone-Ethinyl Estradiol-Fe Biphas (LO LOESTRIN FE) 1 MG-10 MCG / 10 MCG tablet TAKE 1 TABLET BY MOUTH AT BEDTIME FOR 28 DAYS. 3 Package 3  . doxycycline (VIBRAMYCIN) 100 MG capsule Take 100 mg by mouth 2 (two) times daily.    . valACYclovir (VALTREX) 1000 MG tablet Take 1,000 mg by mouth 3 (three) times daily.     No current facility-administered medications on file prior to visit.    Objective:     Vitals:   11/27/19 1357  BP: (!) 151/91  Pulse: 83              Examination of lower extremity thigh reveals healing cluster of previous vesicular lesions.  Patient showed me a picture on her phone from when it was the worst.  Assessment:    G1P1001 Patient Active Problem List   Diagnosis Date Noted  . Post-dates pregnancy 07/11/2018  . Gestational hypertension, third trimester 07/08/2018  . Labor and delivery indication for care or intervention 07/08/2018  . Kidney stone complicating pregnancy, third trimester 04/18/2018  .  Kidney stone 04/18/2018  . Pregnancy 04/13/2018  . Rh negative state in antepartum period 01/04/2018     1. Thigh shingles     Consistent with the picture and presentation of shingles.  Valtrex working.  Lesions almost  completely healed.   Plan:            1.  We have discussed the natural course and history of chickenpox/shingles.  All of her questions were answered.  She plans to complete her course of Valtrex. Orders No orders of the defined types were placed in this encounter.   No orders of the defined types were placed in this encounter.     F/U  Return for Annual Physical. I spent 21 minutes involved in the care of this patient preparing to see the patient by obtaining and reviewing her medical history (including labs, imaging tests and prior procedures), documenting clinical information in the electronic health record (EHR), counseling and coordinating care plans, writing and sending prescriptions, ordering tests or procedures and directly communicating with the patient by discussing pertinent items from her history and physical exam as well as detailing my assessment and plan as noted above so that she has an informed understanding.  All of her questions were answered.  Taylor Galvan, M.D. 11/27/2019 2:33 PM

## 2020-03-30 ENCOUNTER — Other Ambulatory Visit: Payer: Self-pay | Admitting: Obstetrics and Gynecology

## 2020-03-30 DIAGNOSIS — Z30011 Encounter for initial prescription of contraceptive pills: Secondary | ICD-10-CM

## 2020-06-08 ENCOUNTER — Ambulatory Visit (INDEPENDENT_AMBULATORY_CARE_PROVIDER_SITE_OTHER): Payer: 59 | Admitting: Obstetrics and Gynecology

## 2020-06-08 ENCOUNTER — Encounter: Payer: Self-pay | Admitting: Obstetrics and Gynecology

## 2020-06-08 ENCOUNTER — Other Ambulatory Visit: Payer: Self-pay

## 2020-06-08 VITALS — BP 130/80 | HR 73 | Ht 68.0 in | Wt 192.1 lb

## 2020-06-08 DIAGNOSIS — Z30011 Encounter for initial prescription of contraceptive pills: Secondary | ICD-10-CM | POA: Diagnosis not present

## 2020-06-08 DIAGNOSIS — Z01419 Encounter for gynecological examination (general) (routine) without abnormal findings: Secondary | ICD-10-CM | POA: Diagnosis not present

## 2020-06-08 MED ORDER — LO LOESTRIN FE 1 MG-10 MCG / 10 MCG PO TABS: 1.0000 | ORAL_TABLET | Freq: Every day | ORAL | 3 refills | Status: DC

## 2020-06-08 NOTE — Progress Notes (Signed)
HPI:      [name] is a 24 y.o. G1P1001 who LMP was Patient's last menstrual period was 06/06/2020 (exact date).  Subjective:   She presents today for her annual examination.  She has no complaints today.  She does have some concerns regarding her OCPs.  When she was a teenager she was told that the OCPs she was on are making her blood pressure elevated.  She has some concerns at these OCPs will make her blood pressure elevated she will have problems from this elevated blood pressure.    Hx: The following portions of the patient's history were reviewed and updated as appropriate:             She  has a past medical history of Kidney stone. She does not have any pertinent problems on file. She  has a past surgical history that includes No past surgeries. Her family history includes Hypertension in her maternal grandmother and mother; Kidney Stones in her maternal grandfather and mother. She  reports that she has quit smoking. She has quit using smokeless tobacco. She reports that she does not drink alcohol and does not use drugs. She has a current medication list which includes the following prescription(s): lo loestrin fe and valacyclovir. She has No Known Allergies.       Review of Systems:  Review of Systems  Constitutional: Denied constitutional symptoms, night sweats, recent illness, fatigue, fever, insomnia and weight loss.  Eyes: Denied eye symptoms, eye pain, photophobia, vision change and visual disturbance.  Ears/Nose/Throat/Neck: Denied ear, nose, throat or neck symptoms, hearing loss, nasal discharge, sinus congestion and sore throat.  Cardiovascular: Denied cardiovascular symptoms, arrhythmia, chest pain/pressure, edema, exercise intolerance, orthopnea and palpitations.  Respiratory: Denied pulmonary symptoms, asthma, pleuritic pain, productive sputum, cough, dyspnea and wheezing.  Gastrointestinal: Denied, gastro-esophageal reflux, melena, nausea and vomiting.   Genitourinary: Denied genitourinary symptoms including symptomatic vaginal discharge, pelvic relaxation issues, and urinary complaints.  Musculoskeletal: Denied musculoskeletal symptoms, stiffness, swelling, muscle weakness and myalgia.  Dermatologic: Denied dermatology symptoms, rash and scar.  Neurologic: Denied neurology symptoms, dizziness, headache, neck pain and syncope.  Psychiatric: Denied psychiatric symptoms, anxiety and depression.  Endocrine: Denied endocrine symptoms including hot flashes and night sweats.   Meds:   Current Outpatient Medications on File Prior to Visit  Medication Sig Dispense Refill  . valACYclovir (VALTREX) 1000 MG tablet Take 1,000 mg by mouth 3 (three) times daily.     No current facility-administered medications on file prior to visit.    Objective:     Vitals:   06/08/20 1451  BP: 130/80  Pulse: 73              Physical examination General NAD, Conversant  HEENT Atraumatic; Op clear with mmm.  Normo-cephalic. Pupils reactive. Anicteric sclerae  Thyroid/Neck Smooth without nodularity or enlargement. Normal ROM.  Neck Supple.  Skin No rashes, lesions or ulceration. Normal palpated skin turgor. No nodularity.  Breasts: No masses or discharge.  Symmetric.  No axillary adenopathy.  Lungs: Clear to auscultation.No rales or wheezes. Normal Respiratory effort, no retractions.  Heart: NSR.  No murmurs or rubs appreciated. No periferal edema  Abdomen: Soft.  Non-tender.  No masses.  No HSM. No hernia  Extremities: Moves all appropriately.  Normal ROM for age. No lymphadenopathy.  Neuro: Oriented to PPT.  Normal mood. Normal affect.     Pelvic:   Vulva: Normal appearance.  No lesions.  Vagina: No lesions or abnormalities noted.  Support:  Normal pelvic support.  Urethra No masses tenderness or scarring.  Meatus Normal size without lesions or prolapse.  Cervix: Normal appearance.  No lesions.  Anus: Normal exam.  No lesions.  Perineum: Normal  exam.  No lesions.        Bimanual   Uterus: Normal size.  Non-tender.  Mobile.  AV.  Adnexae: No masses.  Non-tender to palpation.  Cul-de-sac: Negative for abnormality.      Assessment:    G1P1001 Patient Active Problem List   Diagnosis Date Noted  . Post-dates pregnancy 07/11/2018  . Gestational hypertension, third trimester 07/08/2018  . Labor and delivery indication for care or intervention 07/08/2018  . Kidney stone complicating pregnancy, third trimester 04/18/2018  . Kidney stone 04/18/2018  . Pregnancy 04/13/2018  . Rh negative state in antepartum period 01/04/2018     1. Well woman exam with routine gynecological exam   2. Postpartum care and examination immediately after delivery   3. Initiation of OCP (BCP)     I have reassured her that her blood pressure is fine and that she will not likely have any issues from OCPs and blood pressure fluctuations.  Because of her concerns we have discussed using multiple other forms of birth control that have no effect on blood pressure, mostly those containing progesterone without estrogen.  All of her questions regarding other forms of birth control were answered.   Plan:            1.  Basic Screening Recommendations The basic screening recommendations for asymptomatic women were discussed with the patient during her visit.  The age-appropriate recommendations were discussed with her and the rational for the tests reviewed.  When I am informed by the patient that another primary care physician has previously obtained the age-appropriate tests and they are up-to-date, only outstanding tests are ordered and referrals given as necessary.  Abnormal results of tests will be discussed with her when all of her results are completed.  Routine preventative health maintenance measures emphasized: Exercise/Diet/Weight control, Tobacco Warnings, Alcohol/Substance use risks and Stress Management 2.  Patient would like to continue OCPs at this  time.  She says that she is considering IUD and if she elects to have this she will call us ahead of time.  Orders No orders of the defined types were placed in this encounter.    Meds ordered this encounter  Medications  . Norethindrone-Ethinyl Estradiol-Fe Biphas (LO LOESTRIN FE) 1 MG-10 MCG / 10 MCG tablet    Sig: Take 1 tablet by mouth daily.    Dispense:  84 tablet    Refill:  3            F/U  Return in about 1 year (around 06/08/2021) for Annual Physical.  Elonda Husky, M.D. 06/08/2020 3:46 PM

## 2020-07-01 IMAGING — US US RENAL
2 series · 13 of 25 positions shown · non-contrast
Comparison: CT Abdomen and Pelvis 03/29/2013

ADDENDUM:
Additional ultrasound images were obtained with a greater degree of
urinary bladder distension. Doppler interrogation now does
successfully demonstrate both ureteral jets (series 2, image 3). No
urinary debris. Unremarkable bladder contour.
CLINICAL DATA: 22-year-old pregnant female in the late 2nd
trimester with acute left flank pain.

EXAM:
RENAL / URINARY TRACT ULTRASOUND COMPLETE

[Series 1: us renal · 0.22mm/px · 11 of 48 slices shown (1 of 2)]
[im 1/48]
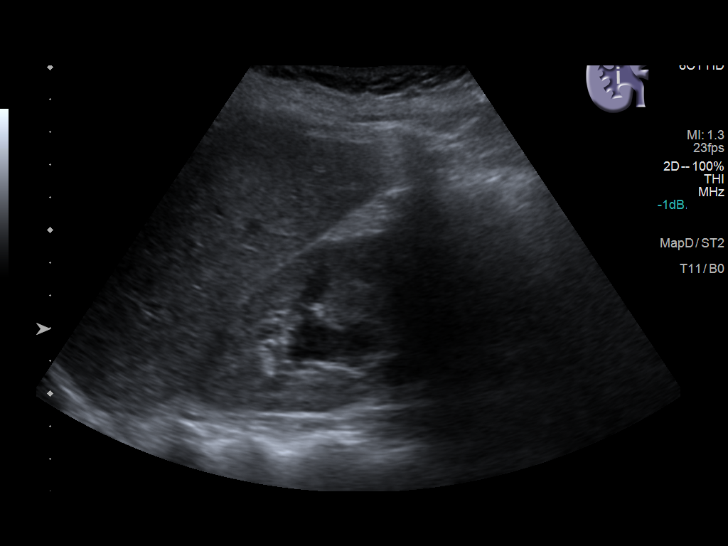
[im 5/48]
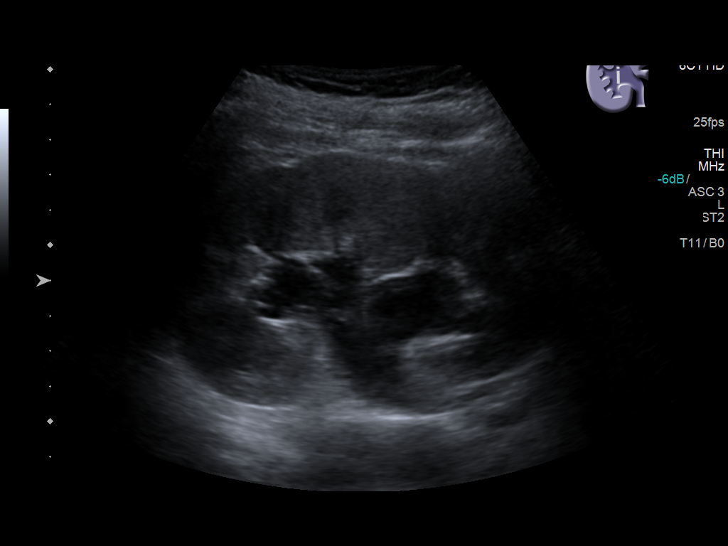
[im 10/48]
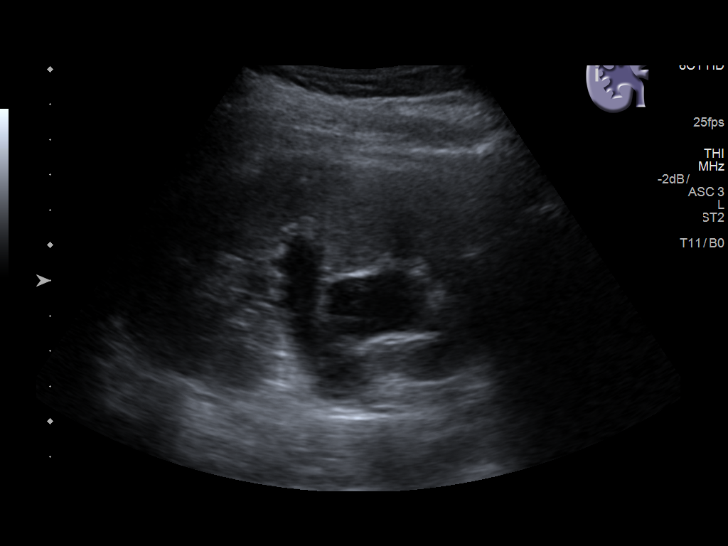
[im 15/48]
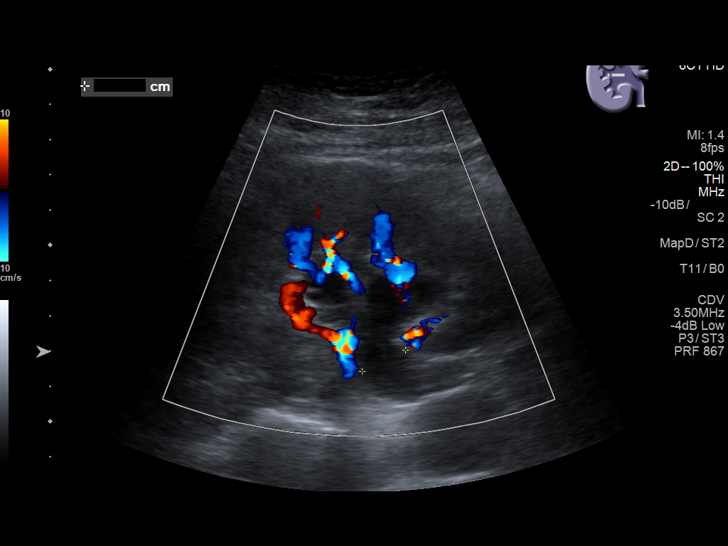
[im 19/48]
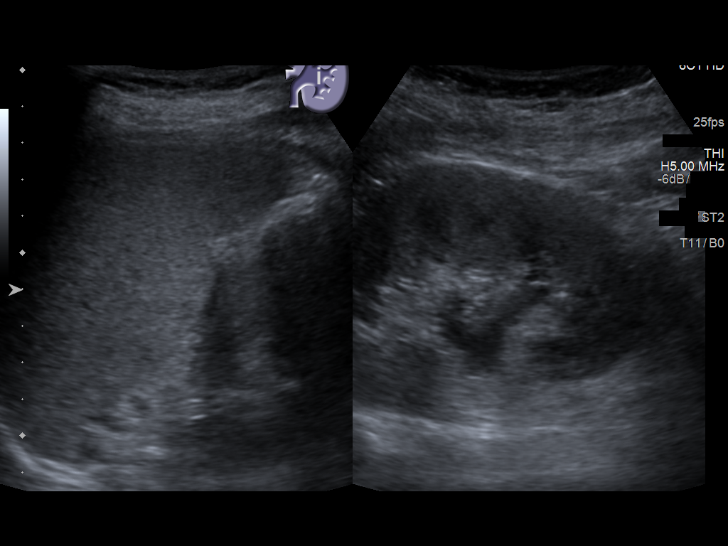
[im 24/48]
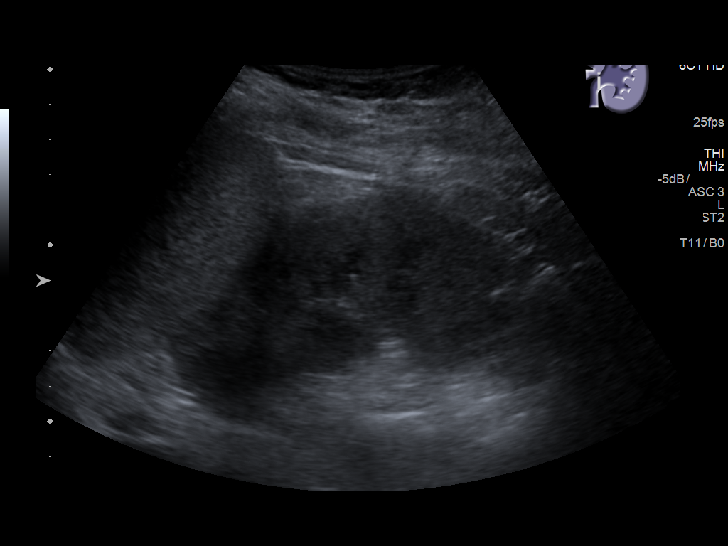
[im 29/48]
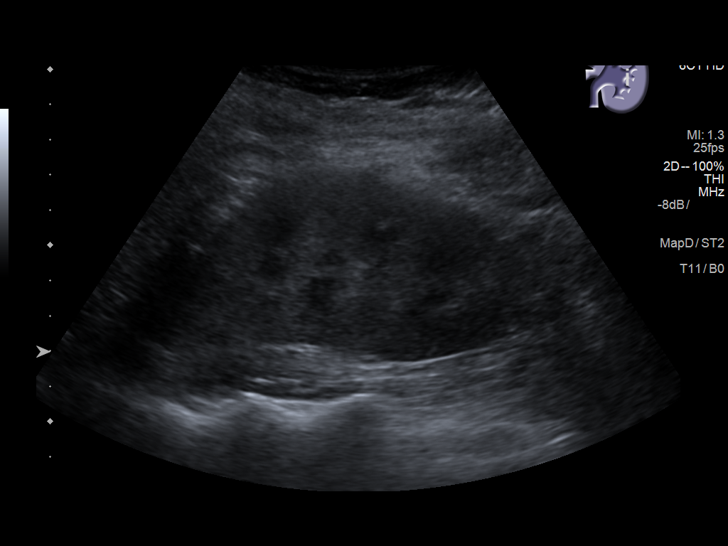
[im 33/48]
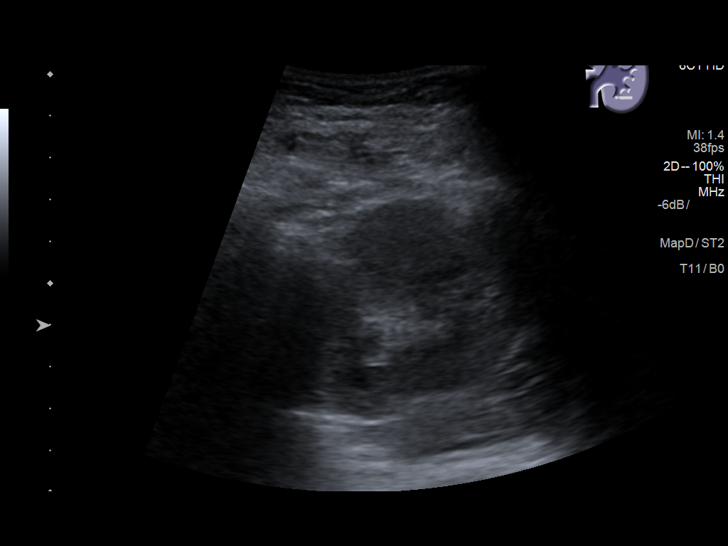
[im 38/48]
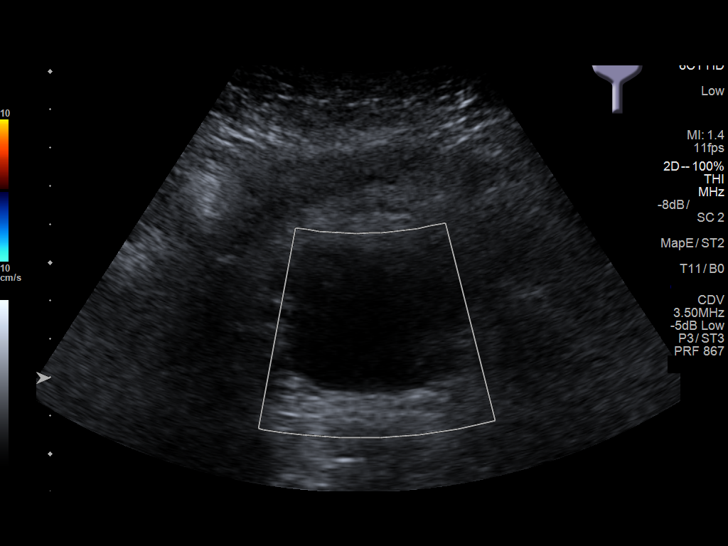
[im 43/48]
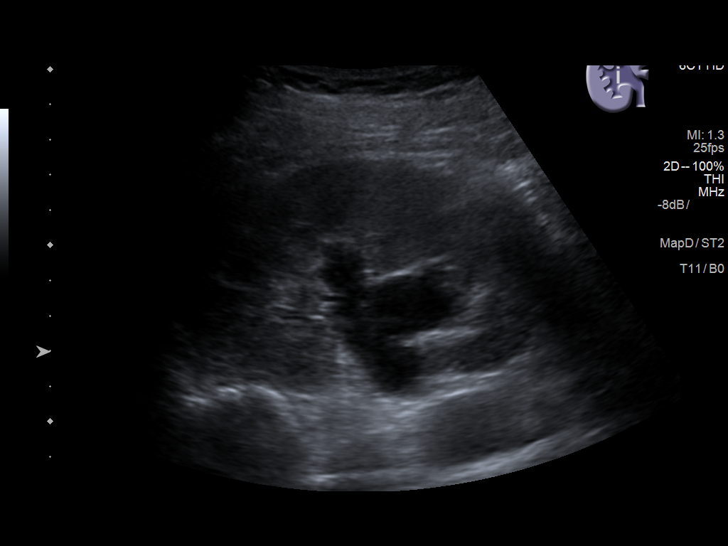
[im 48/48]
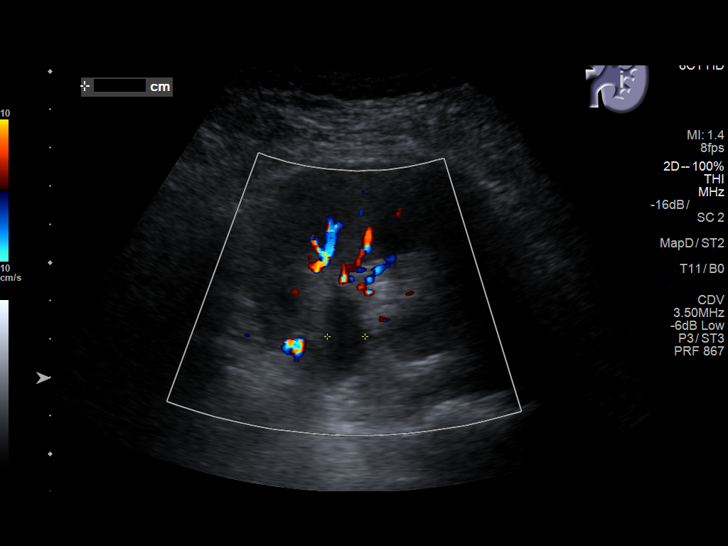

[Series 2001: us renal · 0.23mm/px · 2 of 10 slices shown (2 of 2)]
[im 4/10]
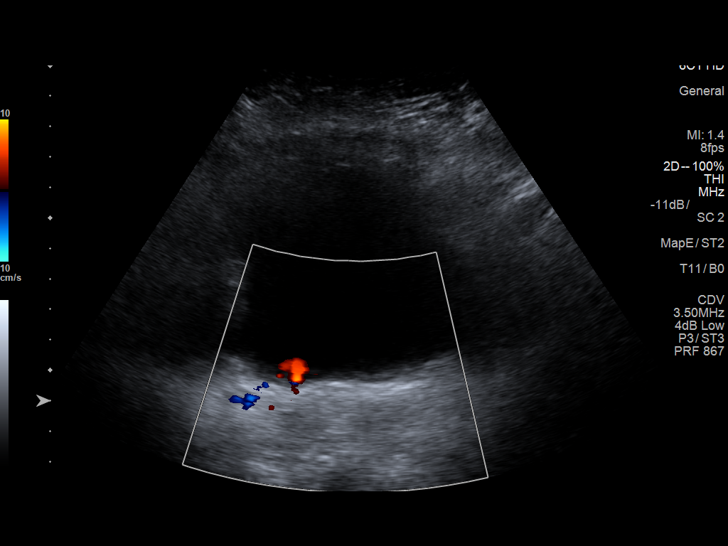
[im 10/10]
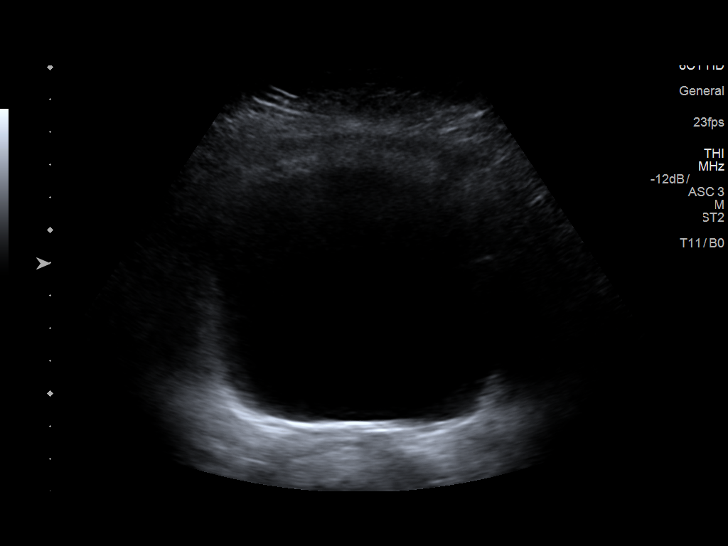

[13 of 25 positions shown; findings below may reference images not displayed]

CONCLUSION: Both ureteral jets detected with Doppler.
FINDINGS: Right Kidney:

Length: 10.9 centimeters. Moderate right hydronephrosis (image 7).
Right renal cortical echogenicity remains normal. No right renal
mass.

Left Kidney:

Length: 11.7 centimeters. Mild left hydronephrosis (image 23). Left
renal cortical echogenicity is within normal limits. No left renal
mass.

Bladder:

Diminutive, estimated bladder volume 19 milliliters. No ureteral
jets were detected with brief Doppler interrogation.

Other findings: Gravid uterus with probable vertex presentation.

The hydronephrosis did not change on postvoid images.
IMPRESSION: 1. Up to moderate right greater than left hydronephrosis.
This patient had obstructive uropathy in 0088 due to a tiny urologic
calculus, but the bilaterality in this clinical setting may instead
reflect maternal hydronephrosis of pregnancy. Consider Urology
consultation.
2. Diminutive urinary bladder at the time of imaging.

## 2020-07-07 ENCOUNTER — Telehealth: Payer: Self-pay

## 2020-07-07 NOTE — Telephone Encounter (Signed)
Patient called in stating that she saw Dr. Marianna Fuss using [facility] and she is stating that this provider is instructing her to come to [place] to get a full panel completed for her thyroid. Informed patient that I was unable to get her scheduled as we need orders in for lab work. Informed patient I would send a message back to her provider to see what we can do for her. Could you please advise?

## 2020-07-08 NOTE — Telephone Encounter (Signed)
LM for patient to return call.

## 2020-07-15 NOTE — Telephone Encounter (Signed)
LM for patient to return call.

## 2020-07-22 NOTE — Telephone Encounter (Signed)
LM for patient to return call.

## 2020-08-12 ENCOUNTER — Other Ambulatory Visit: Payer: Self-pay

## 2020-08-12 ENCOUNTER — Encounter: Payer: Self-pay | Admitting: Family Medicine

## 2020-08-12 ENCOUNTER — Ambulatory Visit: Payer: 59 | Admitting: Family Medicine

## 2020-08-12 VITALS — BP 149/97 | HR 72 | Temp 98.4°F | Ht 68.0 in | Wt 184.0 lb

## 2020-08-12 DIAGNOSIS — Z7689 Persons encountering health services in other specified circumstances: Secondary | ICD-10-CM | POA: Insufficient documentation

## 2020-08-12 DIAGNOSIS — R030 Elevated blood-pressure reading, without diagnosis of hypertension: Secondary | ICD-10-CM | POA: Diagnosis not present

## 2020-08-12 DIAGNOSIS — F419 Anxiety disorder, unspecified: Secondary | ICD-10-CM | POA: Insufficient documentation

## 2020-08-12 HISTORY — DX: Elevated blood-pressure reading, without diagnosis of hypertension: R03.0

## 2020-08-12 LAB — POCT URINALYSIS DIPSTICK
Bilirubin, UA: NEGATIVE
Blood, UA: NEGATIVE
Glucose, UA: NEGATIVE
Ketones, UA: NEGATIVE
Leukocytes, UA: NEGATIVE
Nitrite, UA: NEGATIVE
Protein, UA: NEGATIVE
Spec Grav, UA: <=1.005 — AB (ref 1.010–1.025)
Urobilinogen, UA: 0.2 E.U./dL
pH, UA: 7 (ref 5.0–8.0)

## 2020-08-12 NOTE — Assessment & Plan Note (Signed)
Longstanding history of anxiety/depression.  Has met with a provider on demand and was prescribed wellbutrin, which gave fleeting SI along with panic attacks.  Had been started on escitalopram but did not start the medication.  Has been taking sertraline 12.15m daily until today, is now taking 217mdaily.  Has not met with a local provider for management of anxiety/depression.  Is fearful of starting new medication as well as needing to potentially be on a medication long term.  Discussed option of genetic testing with [facility] and their management of anxiety/depression.  Patient in agreement with referral for psychiatry.

## 2020-08-12 NOTE — Assessment & Plan Note (Signed)
New patient establishment at [facility] for primary care services.

## 2020-08-12 NOTE — Assessment & Plan Note (Signed)
Elevated BP readings when at doctor appointments.  Has BP cuff at home, will take BP once per day for the next week, we will discuss next week, if consistently elevated > 130/80, will start on medication to help with management of blood pressure.  Patient in agreement with plan.

## 2020-08-12 NOTE — Progress Notes (Signed)
Subjective:    Patient ID: Taylor Galvan, female    DOB: 06-30-1996, 24 y.o.   MRN: [id number]  Taylor Galvan is a 24 y.o. female presenting on 08/12/2020 for [place] and Hypertension (pt wants to know if she has high BP, readings are high when at doctors office )   HPI  [name] presents to clinic to establish as a new patient for primary care.  Previous PCP was with her OB/GYN.  Records will not be requested, as are available through the EMR.  Past medical, family, and surgical history reviewed w/ pt.  She has acute concerns today for her elevated blood pressure readings and her anxiety.  Reports she has been having elevated BP readings when at provider appointments, but is unsure if she has HTN vs white coat syndrome.  Reports BP elevations started with pregnancy for her, which was the same for her Mother, who is still taking medications for hypertension.  Has longstanding history of anxiety, which she has had treatment failure with wellbutrin and has currently been taking sertraline.  Has met with a "doc on demand" for this medication treatment.  Denies SI/HI, headaches, visual changes, chest pain, palpitations, leg swelling.   Depression screen [facility] 2/9 08/12/2020 06/03/2019 08/22/2018  Decreased Interest 0 0 0  Down, Depressed, Hopeless 0 0 0  PHQ - 2 Score 0 0 0  Altered sleeping 2 1 0  Tired, decreased energy 2 0 0  Change in appetite 0 0 0  Feeling bad or failure about yourself  2 0 0  Trouble concentrating 2 0 0  Moving slowly or fidgety/restless 2 0 0  Suicidal thoughts 0 0 0  PHQ-9 Score 10 1 0  Difficult doing work/chores - Not difficult at all Not difficult at all    Social History   Tobacco Use  . Smoking status: Current Every Day Smoker  . Smokeless tobacco: Never Used  Vaping Use  . Vaping Use: Never used  Substance Use Topics  . Alcohol use: No  . Drug use: No    Review of Systems  Constitutional: Negative.   HENT: Negative.   Eyes:  Negative.   Respiratory: Negative.   Cardiovascular: Negative.   Gastrointestinal: Negative.   Endocrine: Negative.   Genitourinary: Negative.   Musculoskeletal: Negative.   Skin: Negative.   Allergic/Immunologic: Negative.   Neurological: Negative.   Hematological: Negative.   Psychiatric/Behavioral: Positive for dysphoric mood and sleep disturbance. Negative for agitation, behavioral problems, confusion, decreased concentration, hallucinations, self-injury and suicidal ideas. The patient is nervous/anxious. The patient is not hyperactive.    Per HPI unless specifically indicated above     Objective:    BP (!) 149/97   Pulse 72   Temp 98.4 F (36.9 C) (Oral)   Ht '5\' 8"'  (1.727 m)   Wt 184 lb (83.5 kg)   LMP 07/22/2020 (Approximate)   SpO2 100%   BMI 27.98 kg/m   Wt Readings from Last 3 Encounters:  08/12/20 184 lb (83.5 kg)  06/08/20 192 lb 1 oz (87.1 kg)  11/27/19 186 lb (84.4 kg)    Physical Exam Vitals and nursing note reviewed.  Constitutional:      General: She is not in acute distress.    Appearance: Normal appearance. She is well-developed and well-groomed. She is not ill-appearing or toxic-appearing.  HENT:     Head: Normocephalic and atraumatic.     Nose:     Comments: [name] is in place, covering mouth and  nose. Eyes:     General: Lids are normal. Vision grossly intact.        Right eye: No discharge.        Left eye: No discharge.     Extraocular Movements: Extraocular movements intact.     Conjunctiva/sclera: Conjunctivae normal.     Pupils: Pupils are equal, round, and reactive to light.  Cardiovascular:     Rate and Rhythm: Normal rate and regular rhythm.     Pulses: Normal pulses.     Heart sounds: Normal heart sounds. No murmur heard.  No friction rub. No gallop.   Pulmonary:     Effort: Pulmonary effort is normal. No respiratory distress.     Breath sounds: Normal breath sounds.  Skin:    General: Skin is warm and dry.     Capillary  Refill: Capillary refill takes less than 2 seconds.  Neurological:     General: No focal deficit present.     Mental Status: She is alert and oriented to person, place, and time.  Psychiatric:        Attention and Perception: Attention and perception normal.        Mood and Affect: Affect normal. Mood is anxious and depressed.        Speech: Speech normal.        Behavior: Behavior normal. Behavior is cooperative.        Thought Content: Thought content normal.        Cognition and Memory: Cognition and memory normal.        Judgment: Judgment normal.    Results for orders placed or performed in visit on 08/12/20  POCT urinalysis dipstick  Result Value Ref Range   Color, UA a[place]urn    Clarity, UA clear    Glucose, UA Negative Negative   Bilirubin, UA neg    Ketones, UA neg    Spec Grav, UA <=1.005 (A) 1.010 - 1.025   Blood, UA neg    pH, UA 7.0 5.0 - 8.0   Protein, UA Negative Negative   Urobilinogen, UA 0.2 0.2 or 1.0 E.U./dL   Nitrite, UA neg    Leukocytes, UA Negative Negative   Appearance auburn,clear    Odor none       Assessment & Plan:   Problem List Items Addressed This Visit      Other   Anxiety    Longstanding history of anxiety/depression.  Has met with a provider on demand and was prescribed wellbutrin, which gave fleeting SI along with panic attacks.  Had been started on escitalopram but did not start the medication.  Has been taking sertraline 12.56m daily until today, is now taking 237mdaily.  Has not met with a local provider for management of anxiety/depression.  Is fearful of starting new medication as well as needing to potentially be on a medication long term.  Discussed option of genetic testing with [facility] and their management of anxiety/depression.  Patient in agreement with referral for psychiatry.        Relevant Medications   hydrOXYzine (ATARAX/VISTARIL) 25 MG tablet   sertraline (ZOLOFT) 25 MG tablet   Elevated blood pressure reading without  diagnosis of hypertension    Elevated BP readings when at doctor appointments.  Has BP cuff at home, will take BP once per day for the next week, we will discuss next week, if consistently elevated > 130/80, will start on medication to help with management of blood pressure.  Patient in agreement with  plan.      Encounter to establish care with new doctor    New patient establishment at [facility] for primary care services.         Other Visit Diagnoses    Encounter to establish care    -  Primary   Relevant Orders   POCT urinalysis dipstick (Completed)      No orders of the defined types were placed in this encounter.   Follow up plan: Return in about 1 week (around 08/19/2020) for BP F/U.   Harlin Rain, [place] Family Nurse Practitioner [facility] 08/12/2020, 3:00 PM

## 2020-08-12 NOTE — Patient Instructions (Signed)
I have placed a referral for psychiatry.  The practice we had spoken about that has providers on staff that complete genetic testing to find out which medications may be the most helpful is [facility].  They have locations all over [place].  Their [facility] office is [phone] and they do offer telemedicine appointments.  Continue all medications as prescribed  Take your blood pressure once per day for the next week.  We can plan to discuss your readings over the phone on a virtual visit or through MyChart to decide if we need to start you on medications for blood pressure management  We will plan to see you back in 1 week for blood pressure re-evaluation  You will receive a survey after today's visit either digitally by e-mail or paper by USPS mail. Your experiences and feedback matter to [place].  Please respond so we know how we are doing as we provide care for you.  Call us with any questions/concerns/needs.  It is my goal to be available to you for your health concerns.  Thanks for choosing me to be a partner in your healthcare needs!  Charlaine Dalton, FNP-C Family Nurse Practitioner [facility] Phone: [phone]

## 2021-06-02 ENCOUNTER — Other Ambulatory Visit: Payer: Self-pay | Admitting: Obstetrics and Gynecology

## 2021-06-02 DIAGNOSIS — Z30011 Encounter for initial prescription of contraceptive pills: Secondary | ICD-10-CM

## 2021-06-21 ENCOUNTER — Other Ambulatory Visit: Payer: Self-pay

## 2021-06-21 ENCOUNTER — Other Ambulatory Visit (HOSPITAL_COMMUNITY)
Admission: RE | Admit: 2021-06-21 | Discharge: 2021-06-21 | Disposition: A | Discharge status: Home / Self Care | Payer: 59 | Source: Ambulatory Visit | Attending: Obstetrics and Gynecology | Admitting: Obstetrics and Gynecology

## 2021-06-21 ENCOUNTER — Ambulatory Visit (INDEPENDENT_AMBULATORY_CARE_PROVIDER_SITE_OTHER): Payer: 59 | Admitting: Obstetrics and Gynecology

## 2021-06-21 ENCOUNTER — Encounter: Payer: Self-pay | Admitting: Obstetrics and Gynecology

## 2021-06-21 VITALS — BP 134/83 | HR 65 | Resp 16 | Ht 69.0 in | Wt 196.5 lb

## 2021-06-21 DIAGNOSIS — Z30011 Encounter for initial prescription of contraceptive pills: Secondary | ICD-10-CM

## 2021-06-21 DIAGNOSIS — Z01419 Encounter for gynecological examination (general) (routine) without abnormal findings: Secondary | ICD-10-CM | POA: Diagnosis not present

## 2021-06-21 DIAGNOSIS — Z124 Encounter for screening for malignant neoplasm of cervix: Secondary | ICD-10-CM | POA: Diagnosis not present

## 2021-06-21 DIAGNOSIS — F32A Depression, unspecified: Secondary | ICD-10-CM

## 2021-06-21 MED ORDER — SERTRALINE HCL 100 MG PO TABS: 100.0000 mg | ORAL_TABLET | Freq: Every day | ORAL | 2 refills | Status: DC

## 2021-06-21 MED ORDER — SERTRALINE HCL 25 MG PO TABS: 25.0000 mg | ORAL_TABLET | Freq: Every day | ORAL | 2 refills | Status: DC

## 2021-06-21 MED ORDER — LO LOESTRIN FE 1 MG-10 MCG / 10 MCG PO TABS: 1.0000 | ORAL_TABLET | Freq: Every day | ORAL | 3 refills | Status: DC

## 2021-06-21 NOTE — Progress Notes (Signed)
HPI:      Taylor Galvan is a 25 y.o. G1P1001 who LMP was Patient's last menstrual period was 05/30/2021 (approximate).  Subjective:   She presents today for her annual examination.  She reports that she is taking OCPs and doing well.  She would like to continue OCPs but is considering pregnancy within the next year. She is taking Zoloft at this time and it has definitely helped her with her mood/depression.  She would like to continue Zoloft.    Hx: The following portions of the patient's history were reviewed and updated as appropriate:             She  has a past medical history of Anxiety, Depression, and Kidney stones. She does not have any pertinent problems on file. She  has a past surgical history that includes No past surgeries. Her family history includes Anxiety disorder in her maternal grandfather, maternal grandmother, mother, and paternal grandmother; Hypertension in her maternal grandfather, maternal grandmother, and mother; Kidney Stones in her maternal grandfather and mother. She  reports that she has been smoking. She has never used smokeless tobacco. She reports that she does not drink alcohol and does not use drugs. She has a current medication list which includes the following prescription(s): lo loestrin fe, sertraline, and sertraline. She is allergic to wellbutrin [bupropion].       Review of Systems:  Review of Systems  Constitutional: Denied constitutional symptoms, night sweats, recent illness, fatigue, fever, insomnia and weight loss.  Eyes: Denied eye symptoms, eye pain, photophobia, vision change and visual disturbance.  Ears/Nose/Throat/Neck: Denied ear, nose, throat or neck symptoms, hearing loss, nasal discharge, sinus congestion and sore throat.  Cardiovascular: Denied cardiovascular symptoms, arrhythmia, chest pain/pressure, edema, exercise intolerance, orthopnea and palpitations.  Respiratory: Denied pulmonary symptoms, asthma, pleuritic pain,  productive sputum, cough, dyspnea and wheezing.  Gastrointestinal: Denied, gastro-esophageal reflux, melena, nausea and vomiting.  Genitourinary: Denied genitourinary symptoms including symptomatic vaginal discharge, pelvic relaxation issues, and urinary complaints.  Musculoskeletal: Denied musculoskeletal symptoms, stiffness, swelling, muscle weakness and myalgia.  Dermatologic: Denied dermatology symptoms, rash and scar.  Neurologic: Denied neurology symptoms, dizziness, headache, neck pain and syncope.  Psychiatric: Denied psychiatric symptoms, anxiety and depression.  Endocrine: Denied endocrine symptoms including hot flashes and night sweats.   Meds:   No current outpatient medications on file prior to visit.   No current facility-administered medications on file prior to visit.     Upstream - 06/21/21 1158       Contraception Wrap Up   End Method Oral Contraceptive    Contraception Counseling Provided Yes            The pregnancy intention screening data noted above was reviewed. Potential methods of contraception were discussed. The patient elected to proceed with Oral Contraceptive.     Objective:     Vitals:   06/21/21 1109  BP: 134/83  Pulse: 65  Resp: 16    Filed Weights   06/21/21 1109  Weight: 196 lb 8 oz (89.1 kg)              Physical examination General NAD, Conversant  HEENT Atraumatic; Op clear with mmm.  Normo-cephalic. Pupils reactive. Anicteric sclerae  Thyroid/Neck Smooth without nodularity or enlargement. Normal ROM.  Neck Supple.  Skin No rashes, lesions or ulceration. Normal palpated skin turgor. No nodularity.  Breasts: No masses or discharge.  Symmetric.  No axillary adenopathy.  Lungs: Clear to auscultation.No rales or wheezes. Normal Respiratory effort,  no retractions.  Heart: NSR.  No murmurs or rubs appreciated. No periferal edema  Abdomen: Soft.  Non-tender.  No masses.  No HSM. No hernia  Extremities: Moves all appropriately.   Normal ROM for age. No lymphadenopathy.  Neuro: Oriented to PPT.  Normal mood. Normal affect.     Pelvic:   Vulva: Normal appearance.  No lesions.  Vagina: No lesions or abnormalities noted.  Support: Normal pelvic support.  Urethra No masses tenderness or scarring.  Meatus Normal size without lesions or prolapse.  Cervix: Normal appearance.  No lesions.  Anus: Normal exam.  No lesions.  Perineum: Normal exam.  No lesions.        Bimanual   Uterus: Normal size.  Non-tender.  Mobile.  AV.  Adnexae: No masses.  Non-tender to palpation.  Cul-de-sac: Negative for abnormality.     Assessment:    G1P1001 Patient Active Problem List   Diagnosis Date Noted   Anxiety 08/12/2020   Elevated blood pressure reading without diagnosis of hypertension 08/12/2020   Encounter to establish care with new doctor 08/12/2020   Post-dates pregnancy 07/11/2018   Gestational hypertension, third trimester 07/08/2018   Labor and delivery indication for care or intervention 07/08/2018   Kidney stone complicating pregnancy, third trimester 04/18/2018   Kidney stone 04/18/2018   Pregnancy 04/13/2018   Rh negative state in antepartum period 01/04/2018     1. Well woman exam with routine gynecological exam   2. Cervical cancer screening   3. Depression, unspecified depression type   4. Postpartum care and examination immediately after delivery   5. Initiation of OCP (BCP)        Plan:            1.  Basic Screening Recommendations The basic screening recommendations for asymptomatic women were discussed with the patient during her visit.  The age-appropriate recommendations were discussed with her and the rational for the tests reviewed.  When I am informed by the patient that another primary care physician has previously obtained the age-appropriate tests and they are up-to-date, only outstanding tests are ordered and referrals given as necessary.  Abnormal results of tests will be discussed with  her when all of her results are completed.  Routine preventative health maintenance measures emphasized: Exercise/Diet/Weight control, Tobacco Warnings, Alcohol/Substance use risks and Stress Management Pap performed 2.  Discussed how to stop OCPs.  Timing of intercourse discussed.  Use of OCPs discussed.  Use of antidepressants like Zoloft during first trimester and remainder of pregnancy discussed.  Risks and benefits reviewed. 3.  Patient would like to continue Zoloft at her current dose for depression.  She reports she is doing well with this. Orders No orders of the defined types were placed in this encounter.    Meds ordered this encounter  Medications   Norethindrone-Ethinyl Estradiol-Fe Biphas (LO LOESTRIN FE) 1 MG-10 MCG / 10 MCG tablet    Sig: Take 1 tablet by mouth daily.    Dispense:  84 tablet    Refill:  3    DX Code Needed  .   sertraline (ZOLOFT) 100 MG tablet    Sig: Take 1 tablet (100 mg total) by mouth daily.    Dispense:  90 tablet    Refill:  2   sertraline (ZOLOFT) 25 MG tablet    Sig: Take 1 tablet (25 mg total) by mouth daily.    Dispense:  90 tablet    Refill:  2  F/U  Return in about 1 year (around 06/21/2022) for Annual Physical.  Elonda Husky, M.D. 06/21/2021 12:02 PM

## 2021-06-23 LAB — CYTOLOGY - PAP
Comment: NEGATIVE
Diagnosis: NEGATIVE
Diagnosis: REACTIVE
High risk HPV: NEGATIVE

## 2021-09-14 ENCOUNTER — Telehealth: Payer: Self-pay | Admitting: Obstetrics and Gynecology

## 2021-09-14 NOTE — Telephone Encounter (Signed)
Pt calling in stating that she has been exposed to someone that has the flu and would like to see if something can be called in at this present time she is not having any symptoms but would like to have something on hand so when she does she can start taking it.  Pharm:  [facility] on [address]

## 2022-01-30 ENCOUNTER — Encounter: Payer: Self-pay | Admitting: Certified Nurse Midwife

## 2022-01-30 ENCOUNTER — Ambulatory Visit (INDEPENDENT_AMBULATORY_CARE_PROVIDER_SITE_OTHER): Payer: Self-pay | Admitting: Certified Nurse Midwife

## 2022-01-30 VITALS — BP 128/80 | HR 72 | Ht 69.0 in | Wt 216.3 lb

## 2022-01-30 DIAGNOSIS — Z6831 Body mass index (BMI) 31.0-31.9, adult: Secondary | ICD-10-CM

## 2022-01-30 DIAGNOSIS — E669 Obesity, unspecified: Secondary | ICD-10-CM | POA: Diagnosis not present

## 2022-01-30 MED ORDER — CYANOCOBALAMIN 1000 MCG/ML IJ SOLN
1000.0000 ug | Freq: Once | INTRAMUSCULAR | Status: AC
  Administered 2022-01-30: 1000 ug via INTRAMUSCULAR

## 2022-01-30 MED ORDER — CYANOCOBALAMIN 1000 MCG/ML IJ SOLN: 1000.0000 ug | Freq: Once | INTRAMUSCULAR | 5 refills | Status: AC

## 2022-01-30 MED ORDER — PHENTERMINE HCL 37.5 MG PO TABS: 37.5000 mg | ORAL_TABLET | Freq: Every day | ORAL | 0 refills | Status: DC

## 2022-01-30 NOTE — Progress Notes (Signed)
Subjective:  ?Taylor Galvan is a 26 y.o. G1P1001 at Unknown being seen today for weight loss management- initial visit.  Patient reports General ROS: negative  ? ?Onset was gradual due to lifestyle, anxiety and derepression.  ? ?Onset followed:  starting medication,  ?Associated symptoms include: fatigue, depression, anxiety,  change in clothing fit. ?Previous none ?Pertinent medical history includes: anxiety and depression  ? Risk factors include:none ? The patient has a surgical history of: none ?Pertinent social history includes:none ?Today's evaluation has included: comprehensive metabolic profile, hemoglobin A1c, thyroid panel  and lipid panel. ? ? ?The following portions of the patient's history were reviewed and updated as appropriate: allergies, current medications, past family history, past medical history, past social history, past surgical history and problem list.  ? ?Objective:  ? ?Vitals:  ? 01/30/22 1408  ?BP: 128/80  ?Pulse: 72  ?Weight: 216 lb 4.8 oz (98.1 kg)  ?Height: 5\' 9"  (1.753 m)  ?Waist:40.25 ? ?General:  Alert, oriented and cooperative. Patient is in no acute distress.  ?PE: Well groomed female in no current distress,   ?Mental Status: Normal mood and affect. Normal behavior. Normal judgment and thought content.  ? ?Current BMI: Body mass index is 31.94 kg/m?. ? ? ?Assessment and Plan:  ?Obesity ? ?PMP Aware Website: ?Patients found but no prescriptions found. ?We were able to find this patient. However, there are no prescription records within the prescription fill dates provided.  ? ?Plan: low carb, High protein diet ?RX for adipex 37.5 mg daily and B12 .ml monthly, to start now with first injection given at today's visit. Reviewed side-effects common to both medications and expected outcomes. ?Increase daily water intake to at least 8 bottle a day, every day. ? ?Goal is to reduse weight by 5% by end of three months, and will re-evaluate then. ? ?RTC in 4 weeks for Nurse visit  to check weight & BP, and get next B12 injections. ? ? ? ?Please refer to After Visit Summary for other counseling recommendations.  ? ? ? , CNM ? ? ?Consider the Low Glycemic Index Diet and 6 smaller meals daily .  This boosts your metabolism and regulates your sugars: ? ? ?Use the protein bar by Atkins because they have lots of fiber in them ? ?Find the low carb flatbreads, tortillas and pita breads for sandwiches: ? ?Joseph's makes a pita bread and a flat bread , available at [facility] and [employer]; ?Toufayah makes a low carb flatbread available at [facility] and [employer] that is 9 net carbs and 100 cal ?Mission makes a low carb whole wheat tortilla available at [employer] stores with 6 net carbs and 210 cal ? ?Greek yogurt can still have a lot of carbs .  Dannon Light N fit has 80 cal and 8 carbs  ?  ?

## 2022-01-30 NOTE — Patient Instructions (Signed)

## 2022-01-31 LAB — LIPID PANEL
Chol/HDL Ratio: 4.1 ratio (ref 0.0–4.4)
Cholesterol, Total: 183 mg/dL (ref 100–199)
HDL: 45 mg/dL (ref >39)
LDL Chol Calc (NIH): 123 mg/dL — ABNORMAL HIGH (ref 0–99)
Triglycerides: 79 mg/dL (ref 0–149)
VLDL Cholesterol Cal: 15 mg/dL (ref 5–40)

## 2022-01-31 LAB — COMPREHENSIVE METABOLIC PANEL
ALT: 15 IU/L (ref 0–32)
AST: 12 IU/L (ref 0–40)
Albumin/Globulin Ratio: 1.5 (ref 1.2–2.2)
Albumin: 4.1 g/dL (ref 3.9–5.0)
Alkaline Phosphatase: 80 IU/L (ref 44–121)
BUN/Creatinine Ratio: 18 (ref 9–23)
BUN: 15 mg/dL (ref 6–20)
Bilirubin Total: 0.3 mg/dL (ref 0.0–1.2)
CO2: 24 mmol/L (ref 20–29)
Calcium: 9.2 mg/dL (ref 8.7–10.2)
Chloride: 102 mmol/L (ref 96–106)
Creatinine, Ser: 0.85 mg/dL (ref 0.57–1.00)
Globulin, Total: 2.8 g/dL (ref 1.5–4.5)
Glucose: 80 mg/dL (ref 70–99)
Potassium: 4 mmol/L (ref 3.5–5.2)
Sodium: 139 mmol/L (ref 134–144)
Total Protein: 6.9 g/dL (ref 6.0–8.5)
eGFR: 96 mL/min/{1.73_m2} (ref >59)

## 2022-01-31 LAB — THYROID PANEL WITH TSH
Free Thyroxine Index: 2.2 (ref 1.2–4.9)
T3 Uptake Ratio: 24 % (ref 24–39)
T4, Total: 9 ug/dL (ref 4.5–12.0)
TSH: 2.4 u[IU]/mL (ref 0.450–4.500)

## 2022-01-31 LAB — HEMOGLOBIN A1C
Est. average glucose Bld gHb Est-mCnc: 94 mg/dL
Hgb A1c MFr Bld: 4.9 % (ref 4.8–5.6)

## 2022-02-28 ENCOUNTER — Ambulatory Visit (INDEPENDENT_AMBULATORY_CARE_PROVIDER_SITE_OTHER): Payer: 59 | Admitting: Certified Nurse Midwife

## 2022-02-28 ENCOUNTER — Encounter: Payer: Self-pay | Admitting: Certified Nurse Midwife

## 2022-02-28 VITALS — BP 135/85 | HR 68 | Ht 69.0 in | Wt 210.0 lb

## 2022-02-28 DIAGNOSIS — Z30011 Encounter for initial prescription of contraceptive pills: Secondary | ICD-10-CM

## 2022-02-28 DIAGNOSIS — Z7689 Persons encountering health services in other specified circumstances: Secondary | ICD-10-CM

## 2022-02-28 MED ORDER — PHENTERMINE HCL 37.5 MG PO TABS: 37.5000 mg | ORAL_TABLET | Freq: Every day | ORAL | 0 refills | Status: DC

## 2022-02-28 MED ORDER — CYANOCOBALAMIN 1000 MCG/ML IJ SOLN
1000.0000 ug | Freq: Once | INTRAMUSCULAR | Status: AC
  Administered 2022-02-28: 1000 ug via INTRAMUSCULAR

## 2022-02-28 NOTE — Progress Notes (Signed)
SUBJECTIVE:  26 y.o. here for follow-up weight loss visit, previously seen 4 weeks ago. Denies any concerns and feels like medication is working well, She denies any issues with taking the medication . She lost 6 lbs this past month. She states she has increased water, decreased mountain dew and is walking 4 x wk for 1 hr.   OBJECTIVE:  BP (!) 148/87   Pulse 68   Ht 5\' 9"  (1.753 m)   Wt 210 lb (95.3 kg)   BMI 31.01 kg/m   Body mass index is 31.01 kg/m. Waist 39.5 Rpt BP: 135/85  Patient appears well. ASSESSMENT:  Obesity- responding well to weight loss plan PLAN:  To continue with current medications. B12 1048mcg/ml injection given RTC in 4 weeks as planned  80m, CNM

## 2022-02-28 NOTE — Patient Instructions (Signed)
Calorie Counting for Weight Loss Calories are units of energy. Your body needs a certain number of calories from food to keep going throughout the day. When you eat or drink more calories than your body needs, your body stores the extra calories mostly as fat. When you eat or drink fewer calories than your body needs, your body burns fat to get the energy it needs. Calorie counting means keeping track of how many calories you eat and drink each day. Calorie counting can be helpful if you need to lose weight. If you eat fewer calories than your body needs, you should lose weight. Ask your health care provider what a healthy weight is for you. For calorie counting to work, you will need to eat the right number of calories each day to lose a healthy amount of weight per week. A dietitian can help you figure out how many calories you need in a day and will suggest ways to reach your calorie goal. A healthy amount of weight to lose each week is usually 1-2 lb (0.5-0.9 kg). This usually means that your daily calorie intake should be reduced by 500-750 calories. Eating 1,200-1,500 calories a day can help most women lose weight. Eating 1,500-1,800 calories a day can help most men lose weight. What do I need to know about calorie counting? Work with your health care provider or dietitian to determine how many calories you should get each day. To meet your daily calorie goal, you will need to: Find out how many calories are in each food that you would like to eat. Try to do this before you eat. Decide how much of the food you plan to eat. Keep a food log. Do this by writing down what you ate and how many calories it had. To successfully lose weight, it is important to balance calorie counting with a healthy lifestyle that includes regular activity. Where do I find calorie information?  The number of calories in a food can be found on a Nutrition Facts label. If a food does not have a Nutrition Facts label, try  to look up the calories online or ask your dietitian for help. Remember that calories are listed per serving. If you choose to have more than one serving of a food, you will have to multiply the calories per serving by the number of servings you plan to eat. For example, the label on a package of bread might say that a serving size is 1 slice and that there are 90 calories in a serving. If you eat 1 slice, you will have eaten 90 calories. If you eat 2 slices, you will have eaten 180 calories. How do I keep a food log? After each time that you eat, record the following in your food log as soon as possible: What you ate. Be sure to include toppings, sauces, and other extras on the food. How much you ate. This can be measured in cups, ounces, or number of items. How many calories were in each food and drink. The total number of calories in the food you ate. Keep your food log near you, such as in a pocket-sized notebook or on an app or website on your mobile phone. Some programs will calculate calories for you and show you how many calories you have left to meet your daily goal. What are some portion-control tips? Know how many calories are in a serving. This will help you know how many servings you can have of a certain   food. Use a measuring cup to measure serving sizes. You could also try weighing out portions on a kitchen scale. With time, you will be able to estimate serving sizes for some foods. Take time to put servings of different foods on your favorite plates or in your favorite bowls and cups so you know what a serving looks like. Try not to eat straight from a food's packaging, such as from a bag or box. Eating straight from the package makes it hard to see how much you are eating and can lead to overeating. Put the amount you would like to eat in a cup or on a plate to make sure you are eating the right portion. Use smaller plates, glasses, and bowls for smaller portions and to prevent  overeating. Try not to multitask. For example, avoid watching TV or using your computer while eating. If it is time to eat, sit down at a table and enjoy your food. This will help you recognize when you are full. It will also help you be more mindful of what and how much you are eating. What are tips for following this plan? Reading food labels Check the calorie count compared with the serving size. The serving size may be smaller than what you are used to eating. Check the source of the calories. Try to choose foods that are high in protein, fiber, and vitamins, and low in saturated fat, trans fat, and sodium. Shopping Read nutrition labels while you shop. This will help you make healthy decisions about which foods to buy. Pay attention to nutrition labels for low-fat or fat-free foods. These foods sometimes have the same number of calories or more calories than the full-fat versions. They also often have added sugar, starch, or salt to make up for flavor that was removed with the fat. Make a grocery list of lower-calorie foods and stick to it. Cooking Try to cook your favorite foods in a healthier way. For example, try baking instead of frying. Use low-fat dairy products. Meal planning Use more fruits and vegetables. One-half of your plate should be fruits and vegetables. Include lean proteins, such as chicken, turkey, and fish. Lifestyle Each week, aim to do one of the following: 150 minutes of moderate exercise, such as walking. 75 minutes of vigorous exercise, such as running. General information Know how many calories are in the foods you eat most often. This will help you calculate calorie counts faster. Find a way of tracking calories that works for you. Get creative. Try different apps or programs if writing down calories does not work for you. What foods should I eat?  Eat nutritious foods. It is better to have a nutritious, high-calorie food, such as an avocado, than a food with  few nutrients, such as a bag of potato chips. Use your calories on foods and drinks that will fill you up and will not leave you hungry soon after eating. Examples of foods that fill you up are nuts and nut butters, vegetables, lean proteins, and high-fiber foods such as whole grains. High-fiber foods are foods with more than 5 g of fiber per serving. Pay attention to calories in drinks. Low-calorie drinks include water and unsweetened drinks. The items listed above may not be a complete list of foods and beverages you can eat. Contact a dietitian for more information. What foods should I limit? Limit foods or drinks that are not good sources of vitamins, minerals, or protein or that are high in unhealthy fats. These   include: Candy. Other sweets. Sodas, specialty coffee drinks, alcohol, and juice. The items listed above may not be a complete list of foods and beverages you should avoid. Contact a dietitian for more information. How do I count calories when eating out? Pay attention to portions. Often, portions are much larger when eating out. Try these tips to keep portions smaller: Consider sharing a meal instead of getting your own. If you get your own meal, eat only half of it. Before you start eating, ask for a container and put half of your meal into it. When available, consider ordering smaller portions from the menu instead of full portions. Pay attention to your food and drink choices. Knowing the way food is cooked and what is included with the meal can help you eat fewer calories. If calories are listed on the menu, choose the lower-calorie options. Choose dishes that include vegetables, fruits, whole grains, low-fat dairy products, and lean proteins. Choose items that are boiled, broiled, grilled, or steamed. Avoid items that are buttered, battered, fried, or served with cream sauce. Items labeled as crispy are usually fried, unless stated otherwise. Choose water, low-fat milk,  unsweetened iced tea, or other drinks without added sugar. If you want an alcoholic beverage, choose a lower-calorie option, such as a glass of wine or light beer. Ask for dressings, sauces, and syrups on the side. These are usually high in calories, so you should limit the amount you eat. If you want a salad, choose a garden salad and ask for grilled meats. Avoid extra toppings such as bacon, cheese, or fried items. Ask for the dressing on the side, or ask for olive oil and vinegar or lemon to use as dressing. Estimate how many servings of a food you are given. Knowing serving sizes will help you be aware of how much food you are eating at restaurants. Where to find more information Centers for Disease Control and Prevention: www.cdc.gov U.[place]. Department of Agriculture: myplate.gov Summary Calorie counting means keeping track of how many calories you eat and drink each day. If you eat fewer calories than your body needs, you should lose weight. A healthy amount of weight to lose per week is usually 1-2 lb (0.5-0.9 kg). This usually means reducing your daily calorie intake by 500-750 calories. The number of calories in a food can be found on a Nutrition Facts label. If a food does not have a Nutrition Facts label, try to look up the calories online or ask your dietitian for help. Use smaller plates, glasses, and bowls for smaller portions and to prevent overeating. Use your calories on foods and drinks that will fill you up and not leave you hungry shortly after a meal. This information is not intended to replace advice given to you by your health care provider. Make sure you discuss any questions you have with your health care provider. Document Revised: 10/30/2019 Document Reviewed: 10/30/2019 Elsevier Patient Education  2023 Elsevier Inc.  

## 2022-04-06 ENCOUNTER — Encounter: Payer: 59 | Admitting: Certified Nurse Midwife

## 2022-05-01 ENCOUNTER — Ambulatory Visit: Payer: 59 | Admitting: Certified Nurse Midwife

## 2022-05-01 ENCOUNTER — Encounter: Payer: Self-pay | Admitting: Certified Nurse Midwife

## 2022-05-01 VITALS — BP 99/64 | HR 58 | Ht 69.0 in | Wt 203.7 lb

## 2022-05-01 DIAGNOSIS — E669 Obesity, unspecified: Secondary | ICD-10-CM

## 2022-05-01 DIAGNOSIS — Z7689 Persons encountering health services in other specified circumstances: Secondary | ICD-10-CM

## 2022-05-01 DIAGNOSIS — Z30011 Encounter for initial prescription of contraceptive pills: Secondary | ICD-10-CM

## 2022-05-01 MED ORDER — PHENTERMINE HCL 37.5 MG PO TABS: 37.5000 mg | ORAL_TABLET | Freq: Every day | ORAL | 0 refills | Status: DC

## 2022-05-01 MED ORDER — CYANOCOBALAMIN 1000 MCG/ML IJ SOLN
1000.0000 ug | Freq: Once | INTRAMUSCULAR | Status: AC
  Administered 2022-05-01: 1000 ug via INTRAMUSCULAR

## 2022-05-01 MED ORDER — LO LOESTRIN FE 1 MG-10 MCG / 10 MCG PO TABS: 1.0000 | ORAL_TABLET | Freq: Every day | ORAL | 0 refills | Status: DC

## 2022-05-01 NOTE — Progress Notes (Signed)
SUBJECTIVE:  26 y.o. here for follow-up weight loss visit, previously seen 4 weeks ago. Denies any concerns and feels like medication is working well. She denies any issues with medication use. She has lost 7 lbs since her last visit.   OBJECTIVE:  BP 132/90   Pulse (!) 51   Ht 5\' 9"  (1.753 m)   Wt 203 lb 11.2 oz (92.4 kg)   LMP  (LMP Unknown)   BMI 30.08 kg/m   Body mass index is 30.08 kg/m.  Waist: 36.25 in  RPT BP:  94/66 after laying down   Patient appears well. ASSESSMENT:  Obesity- responding well to weight loss plan PLAN:  To continue with current medications. B12 1084mcg/ml injection given RTC in 4 weeks as planned  80m, CNM

## 2022-05-29 ENCOUNTER — Ambulatory Visit: Payer: 59 | Admitting: Certified Nurse Midwife

## 2022-05-29 ENCOUNTER — Encounter: Payer: Self-pay | Admitting: Certified Nurse Midwife

## 2022-05-29 VITALS — BP 131/77 | HR 68 | Ht 69.0 in | Wt 202.2 lb

## 2022-05-29 DIAGNOSIS — Z7689 Persons encountering health services in other specified circumstances: Secondary | ICD-10-CM | POA: Diagnosis not present

## 2022-05-29 MED ORDER — PHENTERMINE HCL 37.5 MG PO TABS
37.5000 mg | ORAL_TABLET | Freq: Every day | ORAL | 0 refills | Status: DC
Start: 2022-05-29 — End: 2023-07-30

## 2022-05-29 MED ORDER — CYANOCOBALAMIN 1000 MCG/ML IJ SOLN: 1000.0000 ug | Freq: Once | INTRAMUSCULAR | 5 refills | Status: AC

## 2022-05-29 MED ORDER — CYANOCOBALAMIN 1000 MCG/ML IJ SOLN
1000.0000 ug | Freq: Once | INTRAMUSCULAR | Status: AC
  Administered 2022-05-29: 1000 ug via INTRAMUSCULAR

## 2022-05-29 NOTE — Progress Notes (Signed)
SUBJECTIVE:  26 y.o. here for follow-up weight loss visit, previously seen 4 weeks ago. Denies any concerns and feels like medication is working. She denies any issues with medication use. She lost 1 lbs this past month. Pt state she has not been as active or focused on weight lost this past month as she has been doing some renovations. Encouraged pt to monitor her diet closely and add additional time to the days she exercise 5-10 min more each session.   OBJECTIVE:  BP 131/77   Pulse 68   Ht 5\' 9"  (1.753 m)   Wt 202 lb 3.2 oz (91.7 kg)   LMP  (LMP Unknown)   BMI 29.86 kg/m   Body mass index is 29.86 kg/m.  Waist: 37.51in  Patient appears well. ASSESSMENT:  Obesity- responding well to weight loss plan PLAN:  To continue with current medications. B12 1028mcg/ml injection given RTC in 4 weeks as planned  80m, CNM

## 2022-05-29 NOTE — Patient Instructions (Signed)

## 2022-06-22 ENCOUNTER — Other Ambulatory Visit: Payer: Self-pay | Admitting: Obstetrics and Gynecology

## 2022-06-22 DIAGNOSIS — F32A Depression, unspecified: Secondary | ICD-10-CM

## 2022-06-26 ENCOUNTER — Telehealth: Payer: Self-pay | Admitting: Obstetrics and Gynecology

## 2022-06-26 ENCOUNTER — Encounter: Payer: 59 | Admitting: Obstetrics and Gynecology

## 2022-06-26 ENCOUNTER — Other Ambulatory Visit: Payer: Self-pay | Admitting: Obstetrics and Gynecology

## 2022-06-26 DIAGNOSIS — F32A Depression, unspecified: Secondary | ICD-10-CM

## 2022-06-26 DIAGNOSIS — Z30011 Encounter for initial prescription of contraceptive pills: Secondary | ICD-10-CM

## 2022-06-26 MED ORDER — LO LOESTRIN FE 1 MG-10 MCG / 10 MCG PO TABS: 1.0000 | ORAL_TABLET | Freq: Every day | ORAL | 0 refills | Status: DC

## 2022-06-26 NOTE — Addendum Note (Signed)
Addended by: Douglass Rivers R on: 06/26/2022 10:35 AM   Modules accepted: Orders

## 2022-06-26 NOTE — Telephone Encounter (Signed)
This pt is scheduled for her annual on 08/08/2022 and she needs her Norethindrone-Ethinyl Estradiol-Fe Biphas (LO LOESTRIN FE) 1 MG-10 MCG / 10 MCG tablet to be called in until this appt thank you

## 2022-06-26 NOTE — Telephone Encounter (Signed)
One refill sent until annual exam.

## 2022-06-27 ENCOUNTER — Encounter: Payer: 59 | Admitting: Certified Nurse Midwife

## 2022-06-29 ENCOUNTER — Other Ambulatory Visit: Payer: Self-pay | Admitting: Obstetrics and Gynecology

## 2022-06-29 DIAGNOSIS — F32A Depression, unspecified: Secondary | ICD-10-CM

## 2022-07-03 ENCOUNTER — Telehealth: Payer: Self-pay

## 2022-07-03 NOTE — Telephone Encounter (Signed)
Pt has her annual scheduled on 11/7 she needs a refill on her Zoloft 125 mg. She has been with out her med's for 2 days, Can she get a refill to last until her appointment?

## 2022-07-04 NOTE — Telephone Encounter (Signed)
Refill okay per provider

## 2022-07-04 NOTE — Telephone Encounter (Signed)
Done

## 2022-07-17 ENCOUNTER — Encounter: Payer: Self-pay | Admitting: Certified Nurse Midwife

## 2022-07-28 ENCOUNTER — Telehealth: Payer: Self-pay

## 2022-07-28 DIAGNOSIS — Z30011 Encounter for initial prescription of contraceptive pills: Secondary | ICD-10-CM

## 2022-07-28 MED ORDER — LO LOESTRIN FE 1 MG-10 MCG / 10 MCG PO TABS: 1.0000 | ORAL_TABLET | Freq: Every day | ORAL | 0 refills | Status: DC

## 2022-07-28 NOTE — Telephone Encounter (Signed)
Taylor Galvan called requesting a refill on her birth control, she already had her annual scheduled so I sent in one refill to hold her over until her visit.

## 2022-08-08 ENCOUNTER — Encounter: Payer: Self-pay | Admitting: Obstetrics and Gynecology

## 2022-08-08 ENCOUNTER — Ambulatory Visit (INDEPENDENT_AMBULATORY_CARE_PROVIDER_SITE_OTHER): Payer: 59 | Admitting: Obstetrics and Gynecology

## 2022-08-08 VITALS — BP 138/90 | HR 71 | Ht 69.0 in | Wt 202.4 lb

## 2022-08-08 DIAGNOSIS — F32A Depression, unspecified: Secondary | ICD-10-CM | POA: Diagnosis not present

## 2022-08-08 DIAGNOSIS — Z3041 Encounter for surveillance of contraceptive pills: Secondary | ICD-10-CM | POA: Diagnosis not present

## 2022-08-08 DIAGNOSIS — Z01419 Encounter for gynecological examination (general) (routine) without abnormal findings: Secondary | ICD-10-CM

## 2022-08-08 DIAGNOSIS — Z7689 Persons encountering health services in other specified circumstances: Secondary | ICD-10-CM

## 2022-08-08 DIAGNOSIS — R062 Wheezing: Secondary | ICD-10-CM

## 2022-08-08 MED ORDER — SERTRALINE HCL 25 MG PO TABS: 25.0000 mg | ORAL_TABLET | Freq: Every day | ORAL | 2 refills | Status: DC

## 2022-08-08 MED ORDER — SERTRALINE HCL 100 MG PO TABS: 100.0000 mg | ORAL_TABLET | Freq: Every day | ORAL | 2 refills | Status: AC

## 2022-08-08 MED ORDER — LO LOESTRIN FE 1 MG-10 MCG / 10 MCG PO TABS: 1.0000 | ORAL_TABLET | Freq: Every day | ORAL | 3 refills | Status: DC

## 2022-08-08 NOTE — Progress Notes (Signed)
Patients presents for annual exam today. She states continuing to have regular cycles with current OCP, would like to continue. She is up to date on pap smear. PHQ-9:4. GAD-7:1. Patient states no other questions or concerns at this time.

## 2022-08-08 NOTE — Progress Notes (Signed)
HPI:      [name] is a 26 y.o. G1P1001 who LMP was Patient's last menstrual period was 07/21/2022 (approximate).  Subjective:   She presents today for her annual examination.  She is taking OCPs without problem.  She has normal regular cycles and she would like to stay on OCPs.  She is taking Zoloft and reports that her anxiety has significantly decreased since then. She does state that ever since she had COVID in 2019 she feels as if she has a minor cough and sometimes congestion.    Hx: The following portions of the patient's history were reviewed and updated as appropriate:             She  has a past medical history of Anxiety, Depression, and Kidney stones. She does not have any pertinent problems on file. She  has a past surgical history that includes No past surgeries. Her family history includes Anxiety disorder in her maternal grandfather, maternal grandmother, mother, and paternal grandmother; Hypertension in her maternal grandfather, maternal grandmother, and mother; Kidney Stones in her maternal grandfather and mother. She  reports that she has quit smoking. Her smoking use included cigarettes. She has never used smokeless tobacco. She reports that she does not drink alcohol and does not use drugs. She has a current medication list which includes the following prescription(s): phentermine, lo loestrin fe, sertraline, and sertraline. She is allergic to wellbutrin [bupropion].       Review of Systems:  Review of Systems  Constitutional: Denied constitutional symptoms, night sweats, recent illness, fatigue, fever, insomnia and weight loss.  Eyes: Denied eye symptoms, eye pain, photophobia, vision change and visual disturbance.  Ears/Nose/Throat/Neck: Denied ear, nose, throat or neck symptoms, hearing loss, nasal discharge, sinus congestion and sore throat.  Cardiovascular: Denied cardiovascular symptoms, arrhythmia, chest pain/pressure, edema, exercise intolerance,  orthopnea and palpitations.  Respiratory: See HPI for additional information.  Gastrointestinal: Denied, gastro-esophageal reflux, melena, nausea and vomiting.  Genitourinary: Denied genitourinary symptoms including symptomatic vaginal discharge, pelvic relaxation issues, and urinary complaints.  Musculoskeletal: Denied musculoskeletal symptoms, stiffness, swelling, muscle weakness and myalgia.  Dermatologic: Denied dermatology symptoms, rash and scar.  Neurologic: Denied neurology symptoms, dizziness, headache, neck pain and syncope.  Psychiatric: Denied psychiatric symptoms, anxiety and depression.  Endocrine: Denied endocrine symptoms including hot flashes and night sweats.   Meds:   Current Outpatient Medications on File Prior to Visit  Medication Sig Dispense Refill   phentermine (ADIPEX-P) 37.5 MG tablet Take 1 tablet (37.5 mg total) by mouth daily before breakfast. 30 tablet 0   No current facility-administered medications on file prior to visit.     Objective:     Vitals:   08/08/22 1101  BP: (!) 138/90  Pulse: 71    Filed Weights   08/08/22 1101  Weight: 202 lb 6.4 oz (91.8 kg)              Physical examination General NAD, Conversant  HEENT Atraumatic; Op clear with mmm.  Normo-cephalic. Pupils reactive. Anicteric sclerae  Thyroid/Neck Smooth without nodularity or enlargement. Normal ROM.  Neck Supple.  Skin No rashes, lesions or ulceration. Normal palpated skin turgor. No nodularity.  Breasts: No masses or discharge.  Symmetric.  No axillary adenopathy.  Lungs Right-sided inspiratory wheeze.  Not cleared with coughing.  Heart: NSR.  No murmurs or rubs appreciated. No periferal edema  Abdomen: Soft.  Non-tender.  No masses.  No HSM. No hernia  Extremities: Moves all appropriately.  Normal ROM  for age. No lymphadenopathy.  Neuro: Oriented to PPT.  Normal mood. Normal affect.     Pelvic:   Vulva: Normal appearance.  No lesions.  Vagina: No lesions or  abnormalities noted.  Support: Normal pelvic support.  Urethra No masses tenderness or scarring.  Meatus Normal size without lesions or prolapse.  Cervix: Normal appearance.  No lesions.  Anus: Normal exam.  No lesions.  Perineum: Normal exam.  No lesions.        Bimanual   Uterus: Normal size.  Non-tender.  Mobile.  AV.  Adnexae: No masses.  Non-tender to palpation.  Cul-de-sac: Negative for abnormality.     Assessment:    G1P1001 Patient Active Problem List   Diagnosis Date Noted   Anxiety 08/12/2020   Elevated blood pressure reading without diagnosis of hypertension 08/12/2020   Encounter to establish care with new doctor 08/12/2020   Gestational hypertension, third trimester 07/08/2018   Kidney stone complicating pregnancy, third trimester 04/18/2018   Kidney stone 04/18/2018   Pregnancy 04/13/2018   Rh negative state in antepartum period 01/04/2018     1. Well woman exam with routine gynecological exam   2. Surveillance for birth control, oral contraceptives   3. Encounter to establish care   4. Depression, unspecified depression type   5. Inspiratory wheeze on examination     Patient with somewhat symptomatic cough/congestion.  Inspiratory wheeze noted.  Question asthma   Plan:            1.  Basic Screening Recommendations The basic screening recommendations for asymptomatic women were discussed with the patient during her visit.  The age-appropriate recommendations were discussed with her and the rational for the tests reviewed.  When I am informed by the patient that another primary care physician has previously obtained the age-appropriate tests and they are up-to-date, only outstanding tests are ordered and referrals given as necessary.  Abnormal results of tests will be discussed with her when all of her results are completed.  Routine preventative health maintenance measures emphasized: Exercise/Diet/Weight control, Tobacco Warnings, Alcohol/Substance use risks  and Stress Management 2.  Continue OCPs 3.  Continue Zoloft 4.  Referral to family medicine for evaluation of inspiratory wheeze/chronic congestion   Orders Orders Placed This Encounter  Procedures   Ambulatory referral to [facility]     Meds ordered this encounter  Medications   Norethindrone-Ethinyl Estradiol-Fe Biphas (LO LOESTRIN FE) 1 MG-10 MCG / 10 MCG tablet    Sig: Take 1 tablet by mouth daily.    Dispense:  90 tablet    Refill:  3    DX Code Needed  .   sertraline (ZOLOFT) 100 MG tablet    Sig: Take 1 tablet (100 mg total) by mouth daily.    Dispense:  90 tablet    Refill:  2    DX Code Needed  .   sertraline (ZOLOFT) 25 MG tablet    Sig: Take 1 tablet (25 mg total) by mouth daily.    Dispense:  90 tablet    Refill:  2    DX Code Needed  .          F/U  Return in about 1 year (around 08/09/2023) for Annual Physical.  Finis Bud, M.D. 08/08/2022 12:13 PM

## 2022-08-16 ENCOUNTER — Telehealth: Payer: Self-pay

## 2022-08-16 NOTE — Telephone Encounter (Signed)
Pt left msg on triage line for Dr. Logan Bores. She said in the past he has told her to use him as a PCP and she wants to know if a Rx for sinus infection can be called in? Has been blowing yellow snot for the past week. Pls advise.

## 2022-11-30 DIAGNOSIS — F410 Panic disorder [episodic paroxysmal anxiety] without agoraphobia: Secondary | ICD-10-CM | POA: Diagnosis not present

## 2022-11-30 DIAGNOSIS — F411 Generalized anxiety disorder: Secondary | ICD-10-CM | POA: Diagnosis not present

## 2022-12-04 DIAGNOSIS — J029 Acute pharyngitis, unspecified: Secondary | ICD-10-CM | POA: Diagnosis not present

## 2022-12-04 DIAGNOSIS — Z6829 Body mass index (BMI) 29.0-29.9, adult: Secondary | ICD-10-CM | POA: Diagnosis not present

## 2022-12-04 DIAGNOSIS — R030 Elevated blood-pressure reading, without diagnosis of hypertension: Secondary | ICD-10-CM | POA: Diagnosis not present

## 2023-01-29 DIAGNOSIS — F331 Major depressive disorder, recurrent, moderate: Secondary | ICD-10-CM | POA: Diagnosis not present

## 2023-02-19 DIAGNOSIS — F331 Major depressive disorder, recurrent, moderate: Secondary | ICD-10-CM | POA: Diagnosis not present

## 2023-03-05 DIAGNOSIS — F331 Major depressive disorder, recurrent, moderate: Secondary | ICD-10-CM | POA: Diagnosis not present

## 2023-03-19 DIAGNOSIS — F331 Major depressive disorder, recurrent, moderate: Secondary | ICD-10-CM | POA: Diagnosis not present

## 2023-04-02 DIAGNOSIS — F331 Major depressive disorder, recurrent, moderate: Secondary | ICD-10-CM | POA: Diagnosis not present

## 2023-04-21 DIAGNOSIS — Z6830 Body mass index (BMI) 30.0-30.9, adult: Secondary | ICD-10-CM | POA: Diagnosis not present

## 2023-04-21 DIAGNOSIS — R030 Elevated blood-pressure reading, without diagnosis of hypertension: Secondary | ICD-10-CM | POA: Diagnosis not present

## 2023-04-21 DIAGNOSIS — L089 Local infection of the skin and subcutaneous tissue, unspecified: Secondary | ICD-10-CM | POA: Diagnosis not present

## 2023-05-17 ENCOUNTER — Ambulatory Visit (INDEPENDENT_AMBULATORY_CARE_PROVIDER_SITE_OTHER): Payer: 59

## 2023-05-17 VITALS — BP 147/85 | HR 89 | Ht 68.0 in | Wt 210.0 lb

## 2023-05-17 DIAGNOSIS — Z3687 Encounter for antenatal screening for uncertain dates: Secondary | ICD-10-CM

## 2023-05-17 DIAGNOSIS — Z3200 Encounter for pregnancy test, result unknown: Secondary | ICD-10-CM

## 2023-05-17 DIAGNOSIS — Z3201 Encounter for pregnancy test, result positive: Secondary | ICD-10-CM | POA: Diagnosis not present

## 2023-05-17 LAB — POCT URINE PREGNANCY: Preg Test, Ur: POSITIVE — AB

## 2023-05-17 NOTE — Progress Notes (Signed)
    NURSE VISIT NOTE  Subjective:    Patient ID: SAVANNHA AHONEN, female    DOB: 10-15-1995, 27 y.o.   MRN: [id number]  HPI  Patient is a 27 y.o. G91P1001 female who presents for evaluation of amenorrhea. She believes she could be pregnant. Current symptoms also include: breast tenderness, nausea, and positive home pregnancy test. Last period unsure as she was on [facility] pills and took antibiotic as well Lmp could be 02/27/23 or 03-08-23.   BP in office high x2 was advised to let pcp know and monitor Bp as she has a machine she can use/barrow.   Objective:    BP (!) 147/85   Pulse 89   Ht 5\' 8"  (1.727 m)   Wt 210 lb (95.3 kg)   LMP  (LMP Unknown)   BMI 31.93 kg/m   Lab Review  Results for orders placed or performed in visit on 05/17/23  POCT urine pregnancy  Result Value Ref Range   Preg Test, Ur Positive (A) Negative    Assessment:   1. Encounter for antenatal screening for uncertain dates   2. Possible pregnancy, not confirmed     Plan:   Pregnancy Test: Positive  She will schedule her nurse visit @ 7-[redacted] wks pregnant, u/s for dating @10  wk, and NOB visit at [redacted] wk pregnant.        Loney Laurence, CMA

## 2023-05-21 ENCOUNTER — Ambulatory Visit (INDEPENDENT_AMBULATORY_CARE_PROVIDER_SITE_OTHER): Payer: 59

## 2023-05-21 ENCOUNTER — Other Ambulatory Visit: Payer: Self-pay | Admitting: Obstetrics and Gynecology

## 2023-05-21 DIAGNOSIS — Z3A11 11 weeks gestation of pregnancy: Secondary | ICD-10-CM | POA: Diagnosis not present

## 2023-05-21 DIAGNOSIS — Z3200 Encounter for pregnancy test, result unknown: Secondary | ICD-10-CM

## 2023-05-21 DIAGNOSIS — Z3687 Encounter for antenatal screening for uncertain dates: Secondary | ICD-10-CM | POA: Diagnosis not present

## 2023-05-23 ENCOUNTER — Ambulatory Visit (INDEPENDENT_AMBULATORY_CARE_PROVIDER_SITE_OTHER): Payer: 59

## 2023-05-23 VITALS — Ht 69.0 in | Wt 210.0 lb

## 2023-05-23 DIAGNOSIS — Z3480 Encounter for supervision of other normal pregnancy, unspecified trimester: Secondary | ICD-10-CM

## 2023-05-23 DIAGNOSIS — Z3689 Encounter for other specified antenatal screening: Secondary | ICD-10-CM

## 2023-05-24 DIAGNOSIS — Z3480 Encounter for supervision of other normal pregnancy, unspecified trimester: Secondary | ICD-10-CM | POA: Insufficient documentation

## 2023-05-24 NOTE — Progress Notes (Signed)
New OB Intake  I connected with  Taylor Galvan on 05/24/23 at 11:15 AM EDT by telephone Video Visit and verified that I am speaking with the correct person using two identifiers. Nurse is located at [facility] and pt is located at home.  I had computer issues therefore this documentation was done the day after intake.  I explained I am completing New OB Intake today. We discussed her EDD of 12/04/2023 that is based on LMP of 02/27/2023. Pt is G2/P1001. I reviewed her allergies, medications, Medical/Surgical/OB history, and appropriate screenings. There are no cats in the home.  Based on history, this is a/an pregnancy uncomplicated .   Patient Active Problem List   Diagnosis Date Noted   Supervision of other normal pregnancy, antepartum 05/24/2023   Anxiety 08/12/2020   Elevated blood pressure reading without diagnosis of hypertension 08/12/2020   Encounter to establish care with new doctor 08/12/2020   Gestational hypertension, third trimester 07/08/2018   Kidney stone complicating pregnancy, third trimester 04/18/2018   Kidney stone 04/18/2018   Pregnancy 04/13/2018   Rh negative state in antepartum period 01/04/2018    Concerns addressed today none  Delivery Plans:  Plans to deliver at [facility].  Anatomy US Explained first scheduled [place] was done Aug 19th and an anatomy scan will be done at 20 weeks.  Labs Discussed genetic screening with patient. Patient declines genetic testing to be drawn at new OB visit. Discussed possible labs to be drawn at new OB appointment.  COVID Vaccine Patient has not had COVID vaccine.   Social Determinants of Health Food Insecurity: denies food insecurity Transportation: Patient denies transportation needs. Childcare: Discussed no children allowed at ultrasound appointments.   First visit review I reviewed new OB appt with pt. I explained she will have ob bloodwork and pap smear/pelvic exam if indicated. Explained pt  will be seen by Hartley Barefoot, CNM at first visit; encounter routed to appropriate provider.   Loran Senters, [place] 05/24/2023  9:08 AM

## 2023-05-24 NOTE — Patient Instructions (Signed)
Second Trimester of Pregnancy  The second trimester of pregnancy is from week 13 through week 27. This is months 4 through 6 of pregnancy. The second trimester is often a time when you feel your best. Your body has adjusted to being pregnant, and you begin to feel better physically. During the second trimester: Morning sickness has lessened or stopped completely. You may have more energy. You may have an increase in appetite. The second trimester is also a time when the unborn baby (fetus) is growing rapidly. At the end of the sixth month, the fetus may be up to 12 inches long and weigh about 1 pounds. You will likely begin to feel the baby move (quickening) between 16 and 20 weeks of pregnancy. Body changes during your second trimester Your body continues to go through many changes during your second trimester. The changes vary and generally return to normal after the baby is born. Physical changes Your weight will continue to increase. You will notice your lower abdomen bulging out. You may begin to get stretch marks on your hips, abdomen, and breasts. Your breasts will continue to grow and to become tender. Dark spots or blotches (chloasma or mask of pregnancy) may develop on your face. A dark line from your belly button to the pubic area (linea nigra) may appear. You may have changes in your hair. These can include thickening of your hair, rapid growth, and changes in texture. Some people also have hair loss during or after pregnancy, or hair that feels dry or thin. Health changes You may develop headaches. You may have heartburn. You may develop constipation. You may develop hemorrhoids or swollen, bulging veins (varicose veins). Your gums may bleed and may be sensitive to brushing and flossing. You may urinate more often because the fetus is pressing on your bladder. You may have back pain. This is caused by: Weight gain. Pregnancy hormones that are relaxing the joints in your  pelvis. A shift in weight and the muscles that support your balance. Follow these instructions at home: Medicines Follow your health care provider's instructions regarding medicine use. Specific medicines may be either safe or unsafe to take during pregnancy. Do not take any medicines unless approved by your health care provider. Take a prenatal vitamin that contains at least 600 micrograms (mcg) of folic acid. Eating and drinking Eat a healthy diet that includes fresh fruits and vegetables, whole grains, good sources of protein such as meat, eggs, or tofu, and low-fat dairy products. Avoid raw meat and unpasteurized juice, milk, and cheese. These carry germs that can harm you and your baby. You may need to take these actions to prevent or treat constipation: Drink enough fluid to keep your urine pale yellow. Eat foods that are high in fiber, such as beans, whole grains, and fresh fruits and vegetables. Limit foods that are high in fat and processed sugars, such as fried or sweet foods. Activity Exercise only as directed by your health care provider. Most people can continue their usual exercise routine during pregnancy. Try to exercise for 30 minutes at least 5 days a week. Stop exercising if you develop contractions in your uterus. Stop exercising if you develop pain or cramping in the lower abdomen or lower back. Avoid exercising if it is very hot or humid or if you are at a high altitude. Avoid heavy lifting. If you choose to, you may have sex unless your health care provider tells you not to. Relieving pain and discomfort Wear a supportive   bra to prevent discomfort from breast tenderness. Take warm sitz baths to soothe any pain or discomfort caused by hemorrhoids. Use hemorrhoid cream if your health care provider approves. Rest with your legs raised (elevated) if you have leg cramps or low back pain. If you develop varicose veins: Wear support hose as told by your health care  provider. Elevate your feet for 15 minutes, 3-4 times a day. Limit salt in your diet. Safety Wear your seat belt at all times when driving or riding in a car. Talk with your health care provider if someone is verbally or physically abusive to you. Lifestyle Do not use hot tubs, steam rooms, or saunas. Do not douche. Do not use tampons or scented sanitary pads. Avoid cat litter boxes and soil used by cats. These carry germs that can cause birth defects in the baby and possibly loss of the fetus by miscarriage or stillbirth. Do not use herbal remedies, alcohol, illegal drugs, or medicines that are not approved by your health care provider. Chemicals in these products can harm your baby. Do not use any products that contain nicotine or tobacco, such as cigarettes, e-cigarettes, and chewing tobacco. If you need help quitting, ask your health care provider. General instructions During a routine prenatal visit, your health care provider will do a physical exam and other tests. He or she will also discuss your overall health. Keep all follow-up visits. This is important. Ask your health care provider for a referral to a local prenatal education class. Ask for help if you have counseling or nutritional needs during pregnancy. Your health care provider can offer advice or refer you to specialists for help with various needs. Where to find more information American Pregnancy Association: americanpregnancy.org American College of Obstetricians and Gynecologists: acog.org/en/Womens%20Health/Pregnancy Office on Women's Health: womenshealth.gov/pregnancy Contact a health care provider if you have: A headache that does not go away when you take medicine. Vision changes or you see spots in front of your eyes. Mild pelvic cramps, pelvic pressure, or nagging pain in the abdominal area. Persistent nausea, vomiting, or diarrhea. A bad-smelling vaginal discharge or foul-smelling urine. Pain when you  urinate. Sudden or extreme swelling of your face, hands, ankles, feet, or legs. A fever. Get help right away if you: Have fluid leaking from your vagina. Have spotting or bleeding from your vagina. Have severe abdominal cramping or pain. Have difficulty breathing. Have chest pain. Have fainting spells. Have not felt your baby move for the time period told by your health care provider. Have new or increased pain, swelling, or redness in an arm or leg. Summary The second trimester of pregnancy is from week 13 through week 27 (months 4 through 6). Do not use herbal remedies, alcohol, illegal drugs, or medicines that are not approved by your health care provider. Chemicals in these products can harm your baby. Exercise only as directed by your health care provider. Most people can continue their usual exercise routine during pregnancy. Keep all follow-up visits. This is important. This information is not intended to replace advice given to you by your health care provider. Make sure you discuss any questions you have with your health care provider. Document Revised: 02/25/2020 Document Reviewed: 01/01/2020 Elsevier Patient Education  2024 [employer] First Trimester of Pregnancy  The first trimester of pregnancy starts on the first day of your last menstrual period until the end of week 12. This is also called months 1 through 3 of pregnancy. Body changes during your first trimester Your   body goes through many changes during pregnancy. The changes usually return to normal after your baby is born. Physical changes You may gain or lose weight. Your breasts may grow larger and hurt. The area around your nipples may get darker. Dark spots or blotches may develop on your face. You may have changes in your hair. Health changes You may feel like you might vomit (nauseous), and you may vomit. You may have heartburn. You may have headaches. You may have trouble pooping (constipation). Your  gums may bleed. Other changes You may get tired easily. You may pee (urinate) more often. Your menstrual periods will stop. You may not feel hungry. You may want to eat certain kinds of food. You may have changes in your emotions from day to day. You may have more dreams. Follow these instructions at home: Medicines Take over-the-counter and prescription medicines only as told by your doctor. Some medicines are not safe during pregnancy. Take a prenatal vitamin that contains at least 600 micrograms (mcg) of folic acid. Eating and drinking Eat healthy meals that include: Fresh fruits and vegetables. Whole grains. Good sources of protein, such as meat, eggs, or tofu. Low-fat dairy products. Avoid raw meat and unpasteurized juice, milk, and cheese. If you feel like you may vomit, or you vomit: Eat 4 or 5 small meals a day instead of 3 large meals. Try eating a few soda crackers. Drink liquids between meals instead of during meals. You may need to take these actions to prevent or treat trouble pooping: Drink enough fluids to keep your pee (urine) pale yellow. Eat foods that are high in fiber. These include beans, whole grains, and fresh fruits and vegetables. Limit foods that are high in fat and sugar. These include fried or sweet foods. Activity Exercise only as told by your doctor. Most people can do their usual exercise routine during pregnancy. Stop exercising if you have cramps or pain in your lower belly (abdomen) or low back. Do not exercise if it is too hot or too humid, or if you are in a place of great height (high altitude). Avoid heavy lifting. If you choose to, you may have sex unless your doctor tells you not to. Relieving pain and discomfort Wear a good support bra if your breasts are sore. Rest with your legs raised (elevated) if you have leg cramps or low back pain. If you have bulging veins (varicose veins) in your legs: Wear support hose as told by your  doctor. Raise your feet for 15 minutes, 3-4 times a day. Limit salt in your food. Safety Wear your seat belt at all times when you are in a car. Talk with your doctor if someone is hurting you or yelling at you. Talk with your doctor if you are feeling sad or have thoughts of hurting yourself. Lifestyle Do not use hot tubs, steam rooms, or saunas. Do not douche. Do not use tampons or scented sanitary pads. Do not use herbal medicines, illegal drugs, or medicines that are not approved by your doctor. Do not drink alcohol. Do not smoke or use any products that contain nicotine or tobacco. If you need help quitting, ask your doctor. Avoid cat litter boxes and soil that is used by cats. These carry germs that can cause harm to the baby and can cause a loss of your baby by miscarriage or stillbirth. General instructions Keep all follow-up visits. This is important. Ask for help if you need counseling or if you need help with   nutrition. Your doctor can give you advice or tell you where to go for help. Visit your dentist. At home, brush your teeth with a soft toothbrush. Floss gently. Write down your questions. Take them to your prenatal visits. Where to find more information American Pregnancy Association: americanpregnancy.org American College of Obstetricians and Gynecologists: www.acog.org Office on Women's Health: womenshealth.gov/pregnancy Contact a doctor if: You are dizzy. You have a fever. You have mild cramps or pressure in your lower belly. You have a nagging pain in your belly area. You continue to feel like you may vomit, you vomit, or you have watery poop (diarrhea) for 24 hours or longer. You have a bad-smelling fluid coming from your vagina. You have pain when you pee. You are exposed to a disease that spreads from person to person, such as chickenpox, measles, Zika virus, HIV, or hepatitis. Get help right away if: You have spotting or bleeding from your vagina. You have  very bad belly cramping or pain. You have shortness of breath or chest pain. You have any kind of injury, such as from a fall or a car crash. You have new or increased pain, swelling, or redness in an arm or leg. Summary The first trimester of pregnancy starts on the first day of your last menstrual period until the end of week 12 (months 1 through 3). Eat 4 or 5 small meals a day instead of 3 large meals. Do not smoke or use any products that contain nicotine or tobacco. If you need help quitting, ask your doctor. Keep all follow-up visits. This information is not intended to replace advice given to you by your health care provider. Make sure you discuss any questions you have with your health care provider. Document Revised: 02/25/2020 Document Reviewed: 01/01/2020 Elsevier Patient Education  2024 [employer] Commonly Asked Questions During Pregnancy  Cats: A parasite can be excreted in cat feces.  To avoid exposure you need to have another person empty the little box.  If you must empty the litter box you will need to wear gloves.  Wash your hands after handling your cat.  This parasite can also be found in raw or undercooked meat so this should also be avoided.  Colds, Sore Throats, Flu: Please check your medication sheet to see what you can take for symptoms.  If your symptoms are unrelieved by these medications please call the office.  Dental Work: Most any dental work your dentist recommends is permitted.  X-rays should only be taken during the first trimester if absolutely necessary.  Your abdomen should be shielded with a lead apron during all x-rays.  Please notify your provider prior to receiving any x-rays.  Novocaine is fine; gas is not recommended.  If your dentist requires a note from us prior to dental work please call the office and we will provide one for you.  Exercise: Exercise is an important part of staying healthy during your pregnancy.  You may continue most exercises  you were accustomed to prior to pregnancy.  Later in your pregnancy you will most likely notice you have difficulty with activities requiring balance like riding a bicycle.  It is important that you listen to your body and avoid activities that put you at a higher risk of falling.  Adequate rest and staying well hydrated are a must!  If you have questions about the safety of specific activities ask your provider.    Exposure to Children with illness: Try to avoid obvious exposure; report any   symptoms to us when noted,  If you have chicken pos, red measles or mumps, you should be immune to these diseases.   Please do not take any vaccines while pregnant unless you have checked with your OB provider.  Fetal Movement: After 28 weeks we recommend you do "kick counts" twice daily.  Lie or sit down in a calm quiet environment and count your baby movements "kicks".  You should feel your baby at least 10 times per hour.  If you have not felt 10 kicks within the first hour get up, walk around and have something sweet to eat or drink then repeat for an additional hour.  If count remains less than 10 per hour notify your provider.  Fumigating: Follow your pest control agent's advice as to how long to stay out of your home.  Ventilate the area well before re-entering.  Hemorrhoids:   Most over-the-counter preparations can be used during pregnancy.  Check your medication to see what is safe to use.  It is important to use a stool softener or fiber in your diet and to drink lots of liquids.  If hemorrhoids seem to be getting worse please call the office.   Hot Tubs:  Hot tubs Jacuzzis and saunas are not recommended while pregnant.  These increase your internal body temperature and should be avoided.  Intercourse:  Sexual intercourse is safe during pregnancy as long as you are comfortable, unless otherwise advised by your provider.  Spotting may occur after intercourse; report any bright red bleeding that is heavier  than spotting.  Labor:  If you know that you are in labor, please go to the hospital.  If you are unsure, please call the office and let us help you decide what to do.  Lifting, straining, etc:  If your job requires heavy lifting or straining please check with your provider for any limitations.  Generally, you should not lift items heavier than that you can lift simply with your hands and arms (no back muscles)  Painting:  Paint fumes do not harm your pregnancy, but may make you ill and should be avoided if possible.  Latex or water based paints have less odor than oils.  Use adequate ventilation while painting.  Permanents & Hair Color:  Chemicals in hair dyes are not recommended as they cause increase hair dryness which can increase hair loss during pregnancy.  " Highlighting" and permanents are allowed.  Dye may be absorbed differently and permanents may not hold as well during pregnancy.  Sunbathing:  Use a sunscreen, as skin burns easily during pregnancy.  Drink plenty of fluids; avoid over heating.  Tanning Beds:  Because their possible side effects are still unknown, tanning beds are not recommended.  Ultrasound Scans:  Routine ultrasounds are performed at approximately 20 weeks.  You will be able to see your baby's general anatomy an if you would like to know the gender this can usually be determined as well.  If it is questionable when you conceived you may also receive an ultrasound early in your pregnancy for dating purposes.  Otherwise ultrasound exams are not routinely performed unless there is a medical necessity.  Although you can request a scan we ask that you pay for it when conducted because insurance does not cover " patient request" scans.  Work: If your pregnancy proceeds without complications you may work until your due date, unless your physician or employer advises otherwise.  Round Ligament Pain/Pelvic Discomfort:  Sharp, shooting pains not associated   with bleeding are  fairly common, usually occurring in the second trimester of pregnancy.  They tend to be worse when standing up or when you remain standing for long periods of time.  These are the result of pressure of certain pelvic ligaments called "round ligaments".  Rest, Tylenol and heat seem to be the most effective relief.  As the womb and fetus grow, they rise out of the pelvis and the discomfort improves.  Please notify the office if your pain seems different than that described.  It may represent a more serious condition.  Common Medications Safe in Pregnancy  Acne:      Constipation:  Benzoyl Peroxide     Colace  Clindamycin      Dulcolax Suppository  Topica Erythromycin     Fibercon  Salicylic Acid      Metamucil         Miralax AVOID:        Senakot   Accutane    Cough:  Retin-A       Cough Drops  Tetracycline      Phenergan w/ Codeine if Rx  Minocycline      Robitussin (Plain & DM)  Antibiotics:     Crabs/Lice:  Ceclor       RID  Cephalosporins    AVOID:  E-Mycins      Kwell  Keflex  Macrobid/Macrodantin   Diarrhea:  Penicillin      Kao-Pectate  Zithromax      Imodium AD         PUSH FLUIDS AVOID:       Cipro     Fever:  Tetracycline      Tylenol (Regular or Extra  Minocycline       Strength)  Levaquin      Extra Strength-Do not          Exceed 8 tabs/24 hrs Caffeine:        <200mg/day (equiv. To 1 cup of coffee or  approx. 3 12 oz sodas)         Gas: Cold/Hayfever:       Gas-X  Benadryl      Mylicon  Claritin       Phazyme  **Claritin-D        Chlor-Trimeton    Headaches:  Dimetapp      ASA-Free Excedrin  Drixoral-Non-Drowsy     Cold Compress  Mucinex (Guaifenasin)     Tylenol (Regular or Extra  Sudafed/Sudafed-12 Hour     Strength)  **Sudafed PE Pseudoephedrine   Tylenol Cold & Sinus     Vicks Vapor Rub  Zyrtec  **AVOID if Problems With Blood Pressure         Heartburn: Avoid lying down for at least 1 hour after meals  Aciphex      Maalox     Rash:  Milk of  Magnesia     Benadryl    Mylanta       1% Hydrocortisone Cream  Pepcid  Pepcid Complete   Sleep Aids:  Prevacid      Ambien   Prilosec       Benadryl  Rolaids       Chamomile Tea  Tums (Limit 4/day)     Unisom         Tylenol PM         Warm milk-add vanilla or  Hemorrhoids:       Sugar for taste  Anusol/Anusol H.C.  (RX: Analapram 2.5%)  Sugar Substitutes:    Hydrocortisone OTC     Ok in moderation  Preparation H      Tucks        Vaseline lotion applied to tissue with wiping    Herpes:     Throat:  Acyclovir      Oragel  Famvir  Valtrex     Vaccines:         Flu Shot Leg Cramps:       *Gardasil  Benadryl      Hepatitis A         Hepatitis B Nasal Spray:       Pneumovax  Saline Nasal Spray     Polio Booster         Tetanus Nausea:       Tuberculosis test or PPD  Vitamin B6 25 mg TID   AVOID:    Dramamine      *Gardasil  Emetrol       Live Poliovirus  Ginger Root 250 mg QID    MMR (measles, mumps &  High Complex Carbs @ Bedtime    rebella)  Sea Bands-Accupressure    Varicella (Chickenpox)  Unisom 1/2 tab TID     *No known complications           If received before Pain:         Known pregnancy;   Darvocet       Resume series after  Lortab        Delivery  Percocet    Yeast:   Tramadol      Femstat  Tylenol 3      Gyne-lotrimin  Ultram       Monistat  Vicodin           MISC:         All Sunscreens           Hair Coloring/highlights          Insect Repellant's          (Including DEET)         Mystic Tans  

## 2023-05-28 ENCOUNTER — Ambulatory Visit (INDEPENDENT_AMBULATORY_CARE_PROVIDER_SITE_OTHER): Payer: 59 | Admitting: Certified Nurse Midwife

## 2023-05-28 VITALS — BP 127/87 | HR 81 | Wt 208.0 lb

## 2023-05-28 DIAGNOSIS — Z3A12 12 weeks gestation of pregnancy: Secondary | ICD-10-CM

## 2023-05-28 DIAGNOSIS — Z1379 Encounter for other screening for genetic and chromosomal anomalies: Secondary | ICD-10-CM | POA: Diagnosis not present

## 2023-05-28 DIAGNOSIS — Z3480 Encounter for supervision of other normal pregnancy, unspecified trimester: Secondary | ICD-10-CM | POA: Diagnosis not present

## 2023-05-28 DIAGNOSIS — Z369 Encounter for antenatal screening, unspecified: Secondary | ICD-10-CM | POA: Diagnosis not present

## 2023-05-28 DIAGNOSIS — Z113 Encounter for screening for infections with a predominantly sexual mode of transmission: Secondary | ICD-10-CM

## 2023-05-28 DIAGNOSIS — Z3481 Encounter for supervision of other normal pregnancy, first trimester: Secondary | ICD-10-CM | POA: Diagnosis not present

## 2023-05-28 DIAGNOSIS — B356 Tinea cruris: Secondary | ICD-10-CM

## 2023-05-28 DIAGNOSIS — Z114 Encounter for screening for human immunodeficiency virus [HIV]: Secondary | ICD-10-CM

## 2023-05-28 DIAGNOSIS — Z131 Encounter for screening for diabetes mellitus: Secondary | ICD-10-CM

## 2023-05-28 DIAGNOSIS — O10919 Unspecified pre-existing hypertension complicating pregnancy, unspecified trimester: Secondary | ICD-10-CM

## 2023-05-28 DIAGNOSIS — Z8759 Personal history of other complications of pregnancy, childbirth and the puerperium: Secondary | ICD-10-CM

## 2023-05-28 NOTE — Progress Notes (Unsigned)
NEW OB HISTORY AND PHYSICAL  SUBJECTIVE:       Taylor Galvan is a 27 y.o. G34P1001 female, Patient's last menstrual period was 02/27/2023 (approximate)., Estimated Date of Delivery: 12/09/23, [redacted]w[redacted]d, presents today for establishment of Prenatal Care. She reports {:313260}   Social history Partner/Relationship: Living situation: Work: Exercise: Substance use:   Gynecologic History Patient's last menstrual period was 02/27/2023 (approximate). {Blank multiple:19196::"Normal","Abnormal","Unknown"} Contraception: {method:5051} Last Pap: ***. Results were: {norm/abn:16337}  Obstetric History OB History  Gravida Para Term Preterm AB Living  2 1 1  0 0 1  SAB IAB Ectopic Multiple Live Births  0 0 0 0 1    # Outcome Date GA Lbr Len/2nd Weight Sex Type Anes PTL Lv  2 Current           1 Term 07/12/18 [redacted]w[redacted]d / 01:34  M Vag-Vacuum EPI  LIV    Past Medical History:  Diagnosis Date   Anxiety    Depression    Kidney stones     Past Surgical History:  Procedure Laterality Date   NO PAST SURGERIES      Current Outpatient Medications on File Prior to Visit  Medication Sig Dispense Refill   sertraline (ZOLOFT) 100 MG tablet Take 1 tablet (100 mg total) by mouth daily. 90 tablet 2   ketoconazole (NIZORAL) 2 % cream Apply 1 Application topically daily. (Patient not taking: Reported on 05/24/2023)     phentermine (ADIPEX-P) 37.5 MG tablet Take 1 tablet (37.5 mg total) by mouth daily before breakfast. (Patient not taking: Reported on 05/17/2023) 30 tablet 0   No current facility-administered medications on file prior to visit.    Allergies  Allergen Reactions   Wellbutrin [Bupropion] Other (See Comments)    Suicidal Ideation, Panic Attacks    Social History   Socioeconomic History   Marital status: Legally Separated    Spouse name: Not on file   Number of children: 1   Years of education: 14   Highest education level: High school graduate  Occupational History   Occupation:  hairstylist  Tobacco Use   Smoking status: Former    Types: Cigarettes   Smokeless tobacco: Never  Vaping Use   Vaping status: Never Used  Substance and Sexual Activity   Alcohol use: No   Drug use: Not Currently    Types: Marijuana   Sexual activity: Yes    Partners: Male    Birth control/protection: Pill, None  Other Topics Concern   Not on file  Social History Narrative   Not on file   Social Determinants of Health   Financial Resource Strain: Low Risk  (05/24/2023)   Overall Financial Resource Strain (CARDIA)    Difficulty of Paying Living Expenses: Not hard at all  Food Insecurity: No Food Insecurity (05/24/2023)   Hunger Vital Sign    Worried About Running Out of Food in the Last Year: Never true    Ran Out of Food in the Last Year: Never true  Transportation Needs: No Transportation Needs (05/24/2023)   PRAPARE - Transportation    Lack of Transportation (Medical): No    Lack of Transportation (Non-Medical): No  Physical Activity: Inactive (05/24/2023)   Exercise Vital Sign    Days of Exercise per Week: 0 days    Minutes of Exercise per Session: 0 min  Stress: No Stress Concern Present (05/24/2023)   [employer] of Occupational Health - Occupational Stress Questionnaire    Feeling of Stress : Not at all  Social Connections: Unknown (  05/24/2023)   Social Connection and Isolation Panel [NHANES]    Frequency of Communication with Friends and Family: More than three times a week    Frequency of Social Gatherings with Friends and Family: Twice a week    Attends Religious Services: Never    Database administrator or Organizations: No    Attends Banker Meetings: Never    Marital Status: Not on file  Intimate Partner Violence: Not At Risk (05/24/2023)   Humiliation, Afraid, Rape, and Kick questionnaire    Fear of Current or Ex-Partner: No    Emotionally Abused: No    Physically Abused: No    Sexually Abused: No    Family History  Problem Relation Age  of Onset   Hypertension Mother    Kidney Stones Mother    Anxiety disorder Mother    Healthy Father    Hypertension Maternal Grandmother    Anxiety disorder Maternal Grandmother    Kidney Stones Maternal Grandfather    Hypertension Maternal Grandfather    Anxiety disorder Maternal Grandfather    COPD Maternal Grandfather    Heart failure Maternal Grandfather    Anxiety disorder Paternal Grandmother    Healthy Half-Brother     The following portions of the patient's history were reviewed and updated as appropriate: allergies, current medications, past OB history, past medical history, past surgical history, past family history, past social history, and problem list.  Constitutional: Denied constitutional symptoms, night sweats, recent illness, fatigue, fever, insomnia and weight loss.  Eyes: Denied eye symptoms, eye pain, photophobia, vision change and visual disturbance.  Ears/Nose/Throat/Neck: Denied ear, nose, throat or neck symptoms, hearing loss, nasal discharge, sinus congestion and sore throat.  Cardiovascular: Denied cardiovascular symptoms, arrhythmia, chest pain/pressure, edema, exercise intolerance, orthopnea and palpitations.  Respiratory: Denied pulmonary symptoms, asthma, pleuritic pain, productive sputum, cough, dyspnea and wheezing.  Gastrointestinal: Denied, gastro-esophageal reflux, melena, nausea and vomiting.  Genitourinary:*** Denied genitourinary symptoms including symptomatic vaginal discharge, pelvic relaxation issues, and urinary complaints.  Musculoskeletal: Denied musculoskeletal symptoms, stiffness, swelling, muscle weakness and myalgia.  Dermatologic: Denied dermatology symptoms, rash and scar.  Neurologic: Denied neurology symptoms, dizziness, headache, neck pain and syncope.  Psychiatric: Denied psychiatric symptoms, anxiety and depression.  Endocrine: Denied endocrine symptoms including hot flashes and night sweats.     OBJECTIVE: Initial Physical Exam  (New OB)  GENERAL APPEARANCE: {appearance:314449::"alert, well appearing"} HEAD: {head:313264::"normocephalic, atraumatic"} MOUTH: {pe mouth simple ob:314450::"mucous membranes moist, pharynx normal without lesions"} THYROID: {pe neck ob:312768::"no thyromegaly or masses present"} BREASTS: {pe breast simple:312769::"no masses noted, no significant tenderness, no palpable axillary nodes, no skin changes"} LUNGS: {pe lungs ob:314451::"clear to auscultation, no wheezes, rales or rhonchi, symmetric air entry"} HEART: {pe heart brief:310035::"regular rate and rhythm","no murmurs"} ABDOMEN: {pe abdomen pregnant simple ob:313266::"soft, nontender, nondistended, no abnormal masses, no epigastric pain"} EXTREMITIES: {pe extremities ob:314458::"no redness or tenderness in the calves or thighs"} SKIN: {pe skin brief ob:314459::"normal coloration and turgor, no rashes"} LYMPH NODES: {pe lymph nodes brief:314538::"no adenopathy palpable"} NEUROLOGIC: {pe neurologic exam ob:312789::"alert, oriented, normal speech, no focal findings or movement disorder noted"}  PELVIC EXAM {pe pelvic exam prenatal obgyn:314539}  ASSESSMENT: {pregnancy state assessment:313271::"Normal pregnancy"}   PLAN: Routine prenatal care. We discussed an overview of prenatal care and when to call. Reviewed diet, exercise, and weight gain recommendations in pregnancy. Discussed benefits of breastfeeding and lactation resources at [facility]. I reviewed labs and answered all questions.  See orders  Guadlupe Spanish, CNM

## 2023-05-29 ENCOUNTER — Encounter: Payer: Self-pay | Admitting: Certified Nurse Midwife

## 2023-05-29 DIAGNOSIS — O10913 Unspecified pre-existing hypertension complicating pregnancy, third trimester: Secondary | ICD-10-CM | POA: Insufficient documentation

## 2023-05-29 DIAGNOSIS — O10919 Unspecified pre-existing hypertension complicating pregnancy, unspecified trimester: Secondary | ICD-10-CM | POA: Insufficient documentation

## 2023-05-29 LAB — CBC/D/PLT+RPR+RH+ABO+RUBIGG...
Antibody Screen: NEGATIVE
Basophils Absolute: 0 10*3/uL (ref 0.0–0.2)
Basos: 0 %
EOS (ABSOLUTE): 0.2 10*3/uL (ref 0.0–0.4)
Eos: 2 %
HCV Ab: NONREACTIVE
HIV Screen 4th Generation wRfx: NONREACTIVE
Hematocrit: 43.1 % (ref 34.0–46.6)
Hemoglobin: 14.4 g/dL (ref 11.1–15.9)
Hepatitis B Surface Ag: NEGATIVE
Immature Grans (Abs): 0 10*3/uL (ref 0.0–0.1)
Immature Granulocytes: 1 %
Lymphocytes Absolute: 1.7 10*3/uL (ref 0.7–3.1)
Lymphs: 21 %
MCH: 30.2 pg (ref 26.6–33.0)
MCHC: 33.4 g/dL (ref 31.5–35.7)
MCV: 90 fL (ref 79–97)
Monocytes Absolute: 0.8 10*3/uL (ref 0.1–0.9)
Monocytes: 9 %
Neutrophils Absolute: 5.8 10*3/uL (ref 1.4–7.0)
Neutrophils: 67 %
Platelets: 224 10*3/uL (ref 150–450)
RBC: 4.77 x10E6/uL (ref 3.77–5.28)
RDW: 12.8 % (ref 11.7–15.4)
RPR Ser Ql: NONREACTIVE
Rh Factor: NEGATIVE
Rubella Antibodies, IGG: 3.93 index (ref >0.99)
Varicella zoster IgG: 2372 {index} (ref >165)
WBC: 8.5 10*3/uL (ref 3.4–10.8)

## 2023-05-29 LAB — HCV INTERPRETATION

## 2023-05-29 LAB — COMPREHENSIVE METABOLIC PANEL
ALT: 13 IU/L (ref 0–32)
AST: 13 IU/L (ref 0–40)
Albumin: 4 g/dL (ref 4.0–5.0)
Alkaline Phosphatase: 63 IU/L (ref 44–121)
BUN/Creatinine Ratio: 15 (ref 9–23)
BUN: 9 mg/dL (ref 6–20)
Bilirubin Total: 0.2 mg/dL (ref 0.0–1.2)
CO2: 21 mmol/L (ref 20–29)
Calcium: 9.2 mg/dL (ref 8.7–10.2)
Chloride: 98 mmol/L (ref 96–106)
Creatinine, Ser: 0.6 mg/dL (ref 0.57–1.00)
Globulin, Total: 2.4 g/dL (ref 1.5–4.5)
Glucose: 82 mg/dL (ref 70–99)
Potassium: 4 mmol/L (ref 3.5–5.2)
Sodium: 134 mmol/L (ref 134–144)
Total Protein: 6.4 g/dL (ref 6.0–8.5)
eGFR: 125 mL/min/{1.73_m2} (ref >59)

## 2023-05-29 LAB — HEMOGLOBIN A1C
Est. average glucose Bld gHb Est-mCnc: 100 mg/dL
Hgb A1c MFr Bld: 5.1 % (ref 4.8–5.6)

## 2023-05-29 MED ORDER — NIFEDIPINE ER OSMOTIC RELEASE 30 MG PO TB24
30.0000 mg | ORAL_TABLET | Freq: Every day | ORAL | 3 refills | Status: DC
Start: 2023-05-29 — End: 2023-12-03

## 2023-05-29 MED ORDER — BETAMETHASONE DIPROPIONATE 0.05 % EX CREA
TOPICAL_CREAM | Freq: Two times a day (BID) | CUTANEOUS | 0 refills | Status: AC
Start: 2023-05-29 — End: 2023-06-12

## 2023-05-29 MED ORDER — MICONAZOLE NITRATE 2 % EX CREA
1.0000 | TOPICAL_CREAM | Freq: Two times a day (BID) | CUTANEOUS | 1 refills | Status: AC
Start: 2023-05-29 — End: 2023-06-19

## 2023-05-30 LAB — URINE CULTURE, OB REFLEX

## 2023-05-30 LAB — CULTURE, OB URINE

## 2023-06-01 LAB — MATERNIT 21 PLUS CORE, BLOOD
Fetal Fraction: 6
Result (T21): NEGATIVE
Trisomy 13 (Patau syndrome): NEGATIVE
Trisomy 18 (Edwards syndrome): NEGATIVE
Trisomy 21 (Down syndrome): NEGATIVE

## 2023-06-05 ENCOUNTER — Ambulatory Visit (INDEPENDENT_AMBULATORY_CARE_PROVIDER_SITE_OTHER): Payer: 59

## 2023-06-05 VITALS — BP 126/75 | HR 78 | Wt 210.1 lb

## 2023-06-05 DIAGNOSIS — Z3A13 13 weeks gestation of pregnancy: Secondary | ICD-10-CM

## 2023-06-05 DIAGNOSIS — Z3480 Encounter for supervision of other normal pregnancy, unspecified trimester: Secondary | ICD-10-CM | POA: Diagnosis not present

## 2023-06-05 DIAGNOSIS — O0991 Supervision of high risk pregnancy, unspecified, first trimester: Secondary | ICD-10-CM

## 2023-06-05 DIAGNOSIS — Z3481 Encounter for supervision of other normal pregnancy, first trimester: Secondary | ICD-10-CM

## 2023-06-05 DIAGNOSIS — O10919 Unspecified pre-existing hypertension complicating pregnancy, unspecified trimester: Secondary | ICD-10-CM

## 2023-06-05 LAB — POCT URINALYSIS DIPSTICK OB
Bilirubin, UA: NEGATIVE
Blood, UA: NEGATIVE
Glucose, UA: NEGATIVE
Ketones, UA: NEGATIVE
Leukocytes, UA: NEGATIVE
Nitrite, UA: NEGATIVE
POC,PROTEIN,UA: NEGATIVE
Spec Grav, UA: 1.015 (ref 1.010–1.025)
Urobilinogen, UA: 0.2 U/dL
pH, UA: 6 (ref 5.0–8.0)

## 2023-06-05 NOTE — Assessment & Plan Note (Signed)
Here to confirm FHT as they were not able to be heard at her NOB visit. FHT in the 150s audible with doppler today.

## 2023-06-05 NOTE — Progress Notes (Signed)
    Return Prenatal Note   Assessment/Plan   Plan  27 y.o. G2P1001 at [redacted]w[redacted]d presents for follow-up OB visit. Reviewed prenatal record including previous visit note.  Supervision of other normal pregnancy, antepartum Here to confirm FHT as they were not able to be heard at her NOB visit. FHT in the 150s audible with doppler today.   Orders Placed This Encounter  Procedures   POC Urinalysis Dipstick OB   Return in about 3 weeks (around 06/26/2023) for ROB.   No future appointments.  For next visit:  continue with routine prenatal care     Subjective   27 y.o. G2P1001 at [redacted]w[redacted]d presents for this follow-up prenatal visit.  Patient has no concerns. Patient reports: Movement: Absent Contractions: Not present  Objective   Flow sheet Vitals: Pulse Rate: 78 BP: 126/75 Fetal Heart Rate (bpm): 150 Total weight gain: 5 lb 1.6 oz (2.313 kg)  General Appearance  No acute distress, well appearing, and well nourished Pulmonary   Normal work of breathing Neurologic   Alert and oriented to person, place, and time Psychiatric   Mood and affect within normal limits  Lindalou Hose Lewis Keats, CNM  06/04/2409:07 AM

## 2023-06-06 LAB — URINALYSIS, ROUTINE W REFLEX MICROSCOPIC
Bilirubin, UA: NEGATIVE
Glucose, UA: NEGATIVE
Ketones, UA: NEGATIVE
Leukocytes,UA: NEGATIVE
Nitrite, UA: NEGATIVE
Protein,UA: NEGATIVE
RBC, UA: NEGATIVE
Specific Gravity, UA: 1.021 (ref 1.005–1.030)
Urobilinogen, Ur: 0.2 mg/dL (ref 0.2–1.0)
pH, UA: 7 (ref 5.0–7.5)

## 2023-06-08 LAB — MONITOR DRUG PROFILE 14(MW)
Amphetamine Scrn, Ur: NEGATIVE ng/mL
BARBITURATE SCREEN URINE: NEGATIVE ng/mL
BENZODIAZEPINE SCREEN, URINE: NEGATIVE ng/mL
Buprenorphine, Urine: NEGATIVE ng/mL
Cocaine (Metab) Scrn, Ur: NEGATIVE ng/mL
Creatinine(Crt), U: 143.4 mg/dL (ref 20.0–300.0)
Fentanyl, Urine: NEGATIVE pg/mL
Meperidine Screen, Urine: NEGATIVE ng/mL
Methadone Screen, Urine: NEGATIVE ng/mL
OXYCODONE+OXYMORPHONE UR QL SCN: NEGATIVE ng/mL
Opiate Scrn, Ur: NEGATIVE ng/mL
Ph of Urine: 6.9 (ref 4.5–8.9)
Phencyclidine Qn, Ur: NEGATIVE ng/mL
Propoxyphene Scrn, Ur: NEGATIVE ng/mL
SPECIFIC GRAVITY: 1.025
Tramadol Screen, Urine: NEGATIVE ng/mL

## 2023-06-08 LAB — CANNABINOID (GC/MS), URINE
Cannabinoid: POSITIVE — AB
Carboxy THC (GC/MS): >750 ng/mL

## 2023-06-08 LAB — NICOTINE SCREEN, URINE: Cotinine Ql Scrn, Ur: NEGATIVE ng/mL

## 2023-06-25 ENCOUNTER — Encounter: Payer: Self-pay | Admitting: Advanced Practice Midwife

## 2023-06-25 ENCOUNTER — Ambulatory Visit (INDEPENDENT_AMBULATORY_CARE_PROVIDER_SITE_OTHER): Payer: 59 | Admitting: Advanced Practice Midwife

## 2023-06-25 VITALS — BP 127/81 | HR 65 | Wt 212.3 lb

## 2023-06-25 DIAGNOSIS — Z3480 Encounter for supervision of other normal pregnancy, unspecified trimester: Secondary | ICD-10-CM

## 2023-06-25 DIAGNOSIS — Z369 Encounter for antenatal screening, unspecified: Secondary | ICD-10-CM

## 2023-06-25 DIAGNOSIS — Z3A16 16 weeks gestation of pregnancy: Secondary | ICD-10-CM

## 2023-06-25 LAB — POCT URINALYSIS DIPSTICK
Bilirubin, UA: NEGATIVE
Blood, UA: NEGATIVE
Glucose, UA: NEGATIVE
Ketones, UA: NEGATIVE
Leukocytes, UA: NEGATIVE
Nitrite, UA: NEGATIVE
Protein, UA: NEGATIVE
Spec Grav, UA: 1.02 (ref 1.010–1.025)
Urobilinogen, UA: 1 E.U./dL
pH, UA: 7.5 (ref 5.0–8.0)

## 2023-06-25 NOTE — Addendum Note (Signed)
Addended by: Burtis Junes on: 06/25/2023 11:17 AM   Modules accepted: Orders

## 2023-06-25 NOTE — Progress Notes (Signed)
Routine Prenatal Care Visit  Subjective  Taylor Galvan is a 27 y.o. G2P1001 at [redacted]w[redacted]d being seen today for ongoing prenatal care.  She is currently monitored for the following issues for this low-risk pregnancy and has Rh negative state in antepartum period; Pregnancy; Kidney stone complicating pregnancy, third trimester; Kidney stone; Gestational hypertension, third trimester; Anxiety; Supervision of other normal pregnancy, antepartum; and Chronic hypertension affecting pregnancy on their problem list.  ----------------------------------------------------------------------------------- Patient reports she is doing well. Reminded her to take baby ASA daily.   Contractions: Not present. Vag. Bleeding: None.  Movement: Absent. Leaking Fluid denies.  ----------------------------------------------------------------------------------- The following portions of the patient's history were reviewed and updated as appropriate: allergies, current medications, past family history, past medical history, past social history, past surgical history and problem list. Problem list updated.  Objective  Blood pressure 127/81, pulse 65, weight 212 lb 4.8 oz (96.3 kg), last menstrual period 02/27/2023. Pregravid weight 205 lb (93 kg) Total Weight Gain 7 lb 4.8 oz (3.311 kg) Urinalysis: Urine Protein    Urine Glucose    Fetal Status: Fetal Heart Rate (bpm): 146   Movement: Absent     General:  Alert, oriented and cooperative. Patient is in no acute distress.  Skin: Skin is warm and dry. No rash noted.   Cardiovascular: Normal heart rate noted  Respiratory: Normal respiratory effort, no problems with respiration noted  Abdomen: Soft, gravid, appropriate for gestational age. Pain/Pressure: Absent     Pelvic:  Cervical exam deferred        Extremities: Normal range of motion.  Edema: Trace  Mental Status: Normal mood and affect. Normal behavior. Normal judgment and thought content.   Assessment   27 y.o.  G2P1001 at [redacted]w[redacted]d by  12/09/2023, by Ultrasound presenting for routine prenatal visit  Plan   second Problems (from 05/23/23 to present)     Problem Noted Resolved   Chronic hypertension affecting pregnancy 05/29/2023 by Dominica Severin, CNM No   Supervision of other normal pregnancy, antepartum 05/24/2023 by Loran Senters, CMA No   Overview Addendum 06/05/2023 10:06 AM by Tandy Gaw Lindalou Hose, CNM     Clinical Staff Provider  Office Location  [facility] Dating  Not found.  Language  English Anatomy US    Flu Vaccine  offer Genetic Screen  NIPS:   TDaP vaccine   offer Hgb A1C or  GTT Early : Third trimester :   Covid declined   LAB RESULTS   Rhogam  O/Negative/-- (08/26 1557)  Blood Type O/Negative/-- (08/26 1557)   Feeding Plan breast Antibody Negative (08/26 1557)  Contraception undecided Rubella 3.93 (08/26 1557)  Circumcision yes RPR Non Reactive (08/26 1557)   Pediatrician  [facility] HBsAg Negative (08/26 1557)   Support Person [place] HIV Non Reactive (08/26 1557)  Prenatal Classes no Varicella 2,372 (08/26 1557)    GBS  (For PCN allergy, check sensitivities)   BTL Consent  Hep C Non Reactive (08/26 1557)   VBAC Consent  Pap Diagnosis  Date Value Ref Range Status  06/21/2021   Final   - Negative for Intraepithelial Lesions or Malignancy (NILM)  06/21/2021 - Benign reactive/reparative changes  Final      Hgb Electro      CF      SMA                    Preterm labor symptoms and general obstetric precautions including but not limited to vaginal bleeding, contractions, leaking of fluid  and fetal movement were reviewed in detail with the patient. Please refer to After Visit Summary for other counseling recommendations.   Return in about 4 weeks (around 07/23/2023) for anatomy and rob.  Tresea Mall, CNM 06/25/2023 11:10 AM

## 2023-07-18 ENCOUNTER — Encounter: Payer: Self-pay | Admitting: Certified Nurse Midwife

## 2023-07-25 ENCOUNTER — Ambulatory Visit
Admission: RE | Admit: 2023-07-25 | Discharge: 2023-07-25 | Disposition: A | Discharge status: Home / Self Care | Payer: 59 | Source: Ambulatory Visit | Attending: Advanced Practice Midwife | Admitting: Advanced Practice Midwife

## 2023-07-25 DIAGNOSIS — Z3A16 16 weeks gestation of pregnancy: Secondary | ICD-10-CM | POA: Insufficient documentation

## 2023-07-25 DIAGNOSIS — Z3480 Encounter for supervision of other normal pregnancy, unspecified trimester: Secondary | ICD-10-CM | POA: Diagnosis not present

## 2023-07-25 DIAGNOSIS — Z3482 Encounter for supervision of other normal pregnancy, second trimester: Secondary | ICD-10-CM | POA: Insufficient documentation

## 2023-07-25 DIAGNOSIS — Z369 Encounter for antenatal screening, unspecified: Secondary | ICD-10-CM | POA: Diagnosis not present

## 2023-07-25 DIAGNOSIS — Z3A20 20 weeks gestation of pregnancy: Secondary | ICD-10-CM | POA: Diagnosis not present

## 2023-07-30 ENCOUNTER — Encounter: Payer: Self-pay | Admitting: Certified Nurse Midwife

## 2023-07-30 ENCOUNTER — Ambulatory Visit (INDEPENDENT_AMBULATORY_CARE_PROVIDER_SITE_OTHER): Payer: 59 | Admitting: Obstetrics

## 2023-07-30 VITALS — BP 120/73 | HR 83 | Wt 216.0 lb

## 2023-07-30 DIAGNOSIS — Z3480 Encounter for supervision of other normal pregnancy, unspecified trimester: Secondary | ICD-10-CM

## 2023-07-30 DIAGNOSIS — Z3A21 21 weeks gestation of pregnancy: Secondary | ICD-10-CM

## 2023-07-30 LAB — POCT URINALYSIS DIPSTICK OB
Bilirubin, UA: NEGATIVE
Blood, UA: NEGATIVE
Glucose, UA: NEGATIVE
Ketones, UA: NEGATIVE
Leukocytes, UA: NEGATIVE
Nitrite, UA: NEGATIVE
Spec Grav, UA: 1.01 (ref 1.010–1.025)
Urobilinogen, UA: 0.2 U/dL
pH, UA: 6.5 (ref 5.0–8.0)

## 2023-07-30 NOTE — Progress Notes (Signed)
Routine Prenatal Care Visit  Subjective  Taylor Galvan is a 27 y.o. G2P1001 at [redacted]w[redacted]d being seen today for ongoing prenatal care.  She is currently monitored for the following issues for this high-risk pregnancy and has Rh negative state in antepartum period; Pregnancy; Kidney stone complicating pregnancy, third trimester; Kidney stone; Gestational hypertension, third trimester; Anxiety; Supervision of other normal pregnancy, antepartum; and Chronic hypertension affecting pregnancy on their problem list.  ----------------------------------------------------------------------------------- Patient reports  she has not received report on her anatomy scan. The tech at the hospital mentioned that she did not get all spinal views. Taylor Galvan is waiting to hear whether she needs a follow up. She is naming her baby Ronette Deter .  Feeling good fetal movement. Contractions: Not present. Vag. Bleeding: None.  Movement: Increased. Leaking Fluid denies.  ----------------------------------------------------------------------------------- The following portions of the patient's history were reviewed and updated as appropriate: allergies, current medications, past family history, past medical history, past social history, past surgical history and problem list. Problem list updated.  Objective  Blood pressure 120/73, pulse 83, weight 216 lb (98 kg), last menstrual period 02/27/2023. Pregravid weight 205 lb (93 kg) Total Weight Gain 11 lb (4.99 kg) Urinalysis: Urine Protein    Urine Glucose    Fetal Status: Fetal Heart Rate (bpm): 145 Fundal Height: 21 cm Movement: Increased     General:  Alert, oriented and cooperative. Patient is in no acute distress.  Skin: Skin is warm and dry. No rash noted.   Cardiovascular: Normal heart rate noted  Respiratory: Normal respiratory effort, no problems with respiration noted  Abdomen: Soft, gravid, appropriate for gestational age. Pain/Pressure: Absent     Pelvic:   Cervical exam deferred        Extremities: Normal range of motion.  Edema: None  Mental Status: Normal mood and affect. Normal behavior. Normal judgment and thought content.   Assessment   27 y.o. G2P1001 at [redacted]w[redacted]d by  12/09/2023, by Ultrasound presenting for routine prenatal visit  Plan   second Problems (from 05/23/23 to present)     Problem Noted Resolved   Chronic hypertension affecting pregnancy 05/29/2023 by Dominica Severin, CNM No   Supervision of other normal pregnancy, antepartum 05/24/2023 by Loran Senters, CMA No   Overview Addendum 07/30/2023 11:59 AM by Mirna Mires, CNM     Clinical Staff Provider  Office Location  [facility] Dating  Not found.  Language  English Anatomy US  Normal female  Flu Vaccine  offer Genetic Screen  NIPS: xx  TDaP vaccine   offer Hgb A1C or  GTT Early : Third trimester :   Covid declined   LAB RESULTS   Rhogam  O/Negative/-- (08/26 1557)  Blood Type O/Negative/-- (08/26 1557)   Feeding Plan breast Antibody Negative (08/26 1557)  Contraception undecided Rubella 3.93 (08/26 1557)  Circumcision yes RPR Non Reactive (08/26 1557)   Pediatrician  [facility] HBsAg Negative (08/26 1557)   Support Person [place] HIV Non Reactive (08/26 1557)  Prenatal Classes no Varicella 2,372 (08/26 1557)    GBS  (For PCN allergy, check sensitivities)   BTL Consent  Hep C Non Reactive (08/26 1557)   VBAC Consent  Pap Diagnosis  Date Value Ref Range Status  06/21/2021   Final   - Negative for Intraepithelial Lesions or Malignancy (NILM)  06/21/2021 - Benign reactive/reparative changes  Final      Hgb Electro      CF      SMA  Preterm labor symptoms and general obstetric precautions including but not limited to vaginal bleeding, contractions, leaking of fluid and fetal movement were reviewed in detail with the patient. Please refer to After Visit Summary for other counseling recommendations.  Will look for her anatomy scan  report.  Return in about 4 weeks (around 08/27/2023) for return OB.  Mirna Mires, CNM  07/30/2023 1:24 PM

## 2023-07-30 NOTE — Addendum Note (Signed)
Addended by: Donnetta Hail on: 07/30/2023 02:53 PM   Modules accepted: Orders

## 2023-08-21 ENCOUNTER — Other Ambulatory Visit: Payer: Self-pay | Admitting: Advanced Practice Midwife

## 2023-08-21 DIAGNOSIS — Z3480 Encounter for supervision of other normal pregnancy, unspecified trimester: Secondary | ICD-10-CM

## 2023-08-21 DIAGNOSIS — Z369 Encounter for antenatal screening, unspecified: Secondary | ICD-10-CM

## 2023-08-21 NOTE — Progress Notes (Signed)
Follow up anatomy scan ordered and message sent to scheduling.

## 2023-08-22 ENCOUNTER — Telehealth: Payer: Self-pay | Admitting: Advanced Practice Midwife

## 2023-08-22 NOTE — Telephone Encounter (Signed)
I contacted the patient via phone to scheduled anatomy f/u appointment. I offered 09/06/23 at 3 pm. I left message for the patient to contact our office to confirm appointment.

## 2023-08-23 NOTE — Telephone Encounter (Signed)
I left message for the patient to contact our office to confirm appointment.

## 2023-08-27 ENCOUNTER — Ambulatory Visit (INDEPENDENT_AMBULATORY_CARE_PROVIDER_SITE_OTHER): Payer: 59

## 2023-08-27 VITALS — BP 118/78 | HR 93 | Wt 222.5 lb

## 2023-08-27 DIAGNOSIS — Z3A25 25 weeks gestation of pregnancy: Secondary | ICD-10-CM

## 2023-08-27 DIAGNOSIS — Z6791 Unspecified blood type, Rh negative: Secondary | ICD-10-CM

## 2023-08-27 DIAGNOSIS — O360120 Maternal care for anti-D [Rh] antibodies, second trimester, not applicable or unspecified: Secondary | ICD-10-CM

## 2023-08-27 DIAGNOSIS — Z3480 Encounter for supervision of other normal pregnancy, unspecified trimester: Secondary | ICD-10-CM

## 2023-08-27 DIAGNOSIS — Z113 Encounter for screening for infections with a predominantly sexual mode of transmission: Secondary | ICD-10-CM

## 2023-08-27 DIAGNOSIS — Z131 Encounter for screening for diabetes mellitus: Secondary | ICD-10-CM

## 2023-08-27 DIAGNOSIS — O10919 Unspecified pre-existing hypertension complicating pregnancy, unspecified trimester: Secondary | ICD-10-CM

## 2023-08-27 DIAGNOSIS — Z130 Encounter for screening for diseases of the blood and blood-forming organs and certain disorders involving the immune mechanism: Secondary | ICD-10-CM

## 2023-08-27 DIAGNOSIS — O10012 Pre-existing essential hypertension complicating pregnancy, second trimester: Secondary | ICD-10-CM

## 2023-08-27 NOTE — Assessment & Plan Note (Signed)
-   Prepared for Rhogam shot at next visit.

## 2023-08-27 NOTE — Progress Notes (Signed)
    Return Prenatal Note   Assessment/Plan   Plan  27 y.o. G2P1001 at [redacted]w[redacted]d presents for follow-up OB visit. Reviewed prenatal record including previous visit note.  Chronic hypertension affecting pregnancy - Remains normotensive on current meds. - Ultrasound scheduled for 12/10.   Supervision of other normal pregnancy, antepartum - Prepared for 1 hour glucola, third trimester labs, and Tdap at next visit.  - Discussed comfort measures for dependent edema. - Reviewed red flag warning signs anticipatory guidance for upcoming prenatal care.   Rh negative state in antepartum period - Prepared for Rhogam shot at next visit.    Orders Placed This Encounter  Procedures   28 Weeks RH-Panel    Standing Status:   Future    Standing Expiration Date:   08/21/2024   Return in about 3 weeks (around 09/17/2023) for ROB with 1 hour glucola.   Future Appointments  Date Time Provider Department Center  09/11/2023  3:00 PM [facility] [place] [facility] None    For next visit:  ROB with 1 hour gluocla, third trimester labs, and Tdap     Subjective   27 y.o. G2P1001 at [redacted]w[redacted]d presents for this follow-up prenatal visit.  Patient has been having increased swelling in feet causing discomfort. Patient reports: Movement: Present  Objective   Flow sheet Vitals: Pulse Rate: 93 BP: 118/78 Fundal Height: 25 cm Fetal Heart Rate (bpm): 150 Total weight gain: 17 lb 8 oz (7.938 kg)  General Appearance  No acute distress, well appearing, and well nourished Pulmonary   Normal work of breathing Neurologic   Alert and oriented to person, place, and time Psychiatric   Mood and affect within normal limits  Lindalou Hose Tyrion Glaude, CNM  08/26/2410:30 AM

## 2023-08-27 NOTE — Assessment & Plan Note (Addendum)
-   Remains normotensive on current meds. - Ultrasound scheduled for 12/10.

## 2023-08-27 NOTE — Assessment & Plan Note (Addendum)
-   Prepared for 1 hour glucola, third trimester labs, and Tdap at next visit.  - Discussed comfort measures for dependent edema. - Reviewed red flag warning signs anticipatory guidance for upcoming prenatal care.

## 2023-09-06 ENCOUNTER — Other Ambulatory Visit: Payer: 59

## 2023-09-11 ENCOUNTER — Other Ambulatory Visit: Payer: Self-pay | Admitting: Advanced Practice Midwife

## 2023-09-11 ENCOUNTER — Ambulatory Visit (INDEPENDENT_AMBULATORY_CARE_PROVIDER_SITE_OTHER): Payer: 59

## 2023-09-11 DIAGNOSIS — Z3A27 27 weeks gestation of pregnancy: Secondary | ICD-10-CM

## 2023-09-11 DIAGNOSIS — Z362 Encounter for other antenatal screening follow-up: Secondary | ICD-10-CM | POA: Diagnosis not present

## 2023-09-11 DIAGNOSIS — Z3480 Encounter for supervision of other normal pregnancy, unspecified trimester: Secondary | ICD-10-CM

## 2023-09-17 ENCOUNTER — Encounter: Payer: Self-pay | Admitting: Obstetrics

## 2023-09-17 ENCOUNTER — Other Ambulatory Visit: Payer: 59

## 2023-09-17 ENCOUNTER — Ambulatory Visit (INDEPENDENT_AMBULATORY_CARE_PROVIDER_SITE_OTHER): Payer: 59 | Admitting: Obstetrics

## 2023-09-17 VITALS — BP 135/85 | HR 80

## 2023-09-17 DIAGNOSIS — O360130 Maternal care for anti-D [Rh] antibodies, third trimester, not applicable or unspecified: Secondary | ICD-10-CM | POA: Diagnosis not present

## 2023-09-17 DIAGNOSIS — Z6791 Unspecified blood type, Rh negative: Secondary | ICD-10-CM

## 2023-09-17 DIAGNOSIS — Z23 Encounter for immunization: Secondary | ICD-10-CM

## 2023-09-17 DIAGNOSIS — Z3480 Encounter for supervision of other normal pregnancy, unspecified trimester: Secondary | ICD-10-CM

## 2023-09-17 DIAGNOSIS — O26893 Other specified pregnancy related conditions, third trimester: Secondary | ICD-10-CM

## 2023-09-17 DIAGNOSIS — Z130 Encounter for screening for diseases of the blood and blood-forming organs and certain disorders involving the immune mechanism: Secondary | ICD-10-CM

## 2023-09-17 DIAGNOSIS — Z131 Encounter for screening for diabetes mellitus: Secondary | ICD-10-CM

## 2023-09-17 DIAGNOSIS — O219 Vomiting of pregnancy, unspecified: Secondary | ICD-10-CM

## 2023-09-17 DIAGNOSIS — O10013 Pre-existing essential hypertension complicating pregnancy, third trimester: Secondary | ICD-10-CM

## 2023-09-17 DIAGNOSIS — Z3A28 28 weeks gestation of pregnancy: Secondary | ICD-10-CM

## 2023-09-17 DIAGNOSIS — Z113 Encounter for screening for infections with a predominantly sexual mode of transmission: Secondary | ICD-10-CM

## 2023-09-17 MED ORDER — RHO D IMMUNE GLOBULIN 1500 UNIT/2ML IJ SOSY
300.0000 ug | PREFILLED_SYRINGE | Freq: Once | INTRAMUSCULAR | Status: AC
  Administered 2023-09-17: 300 ug via INTRAMUSCULAR

## 2023-09-17 MED ORDER — ONDANSETRON HCL 4 MG PO TABS: 4.0000 mg | ORAL_TABLET | Freq: Three times a day (TID) | ORAL | 0 refills | Status: DC | PRN

## 2023-09-17 NOTE — Patient Instructions (Signed)
 Tdap (Tetanus, Diphtheria, Pertussis) Vaccine: What You Need to Know Many vaccine information statements are available in Spanish and other languages. See PromoAge.com.br. 1. Why get vaccinated? Tdap vaccine can prevent tetanus, diphtheria, and pertussis. Diphtheria and pertussis spread from person to person. Tetanus enters the body through cuts or wounds. TETANUS (T) causes painful stiffening of the muscles. Tetanus can lead to serious health problems, including being unable to open the mouth, having trouble swallowing and breathing, or death. DIPHTHERIA (D) can lead to difficulty breathing, heart failure, paralysis, or death. PERTUSSIS (aP), also known as "whooping cough," can cause uncontrollable, violent coughing that makes it hard to breathe, eat, or drink. Pertussis can be extremely serious especially in babies and young children, causing pneumonia, convulsions, brain damage, or death. In teens and adults, it can cause weight loss, loss of bladder control, passing out, and rib fractures from severe coughing. 2. Tdap vaccine Tdap is only for children 7 years and older, adolescents, and adults.  Adolescents should receive a single dose of Tdap, preferably at age 76 or 12 years. Pregnant people should get a dose of Tdap during every pregnancy, preferably during the early part of the third trimester, to help protect the newborn from pertussis. Infants are most at risk for severe, life-threatening complications from pertussis. Adults who have never received Tdap should get a dose of Tdap. Also, adults should receive a booster dose of either Tdap or Td (a different vaccine that protects against tetanus and diphtheria but not pertussis) every 10 years, or after 5 years in the case of a severe or dirty wound or burn. Tdap may be given at the same time as other vaccines. 3. Talk with your health care provider Tell your vaccine provider if the person getting the vaccine: Has had an allergic  reaction after a previous dose of any vaccine that protects against tetanus, diphtheria, or pertussis, or has any severe, life-threatening allergies Has had a coma, decreased level of consciousness, or prolonged seizures within 7 days after a previous dose of any pertussis vaccine (DTP, DTaP, or Tdap) Has seizures or another nervous system problem Has ever had Guillain-Barr Syndrome (also called "GBS") Has had severe pain or swelling after a previous dose of any vaccine that protects against tetanus or diphtheria In some cases, your health care provider may decide to postpone Tdap vaccination until a future visit. People with minor illnesses, such as a cold, may be vaccinated. People who are moderately or severely ill should usually wait until they recover before getting Tdap vaccine.  Your health care provider can give you more information. 4. Risks of a vaccine reaction Pain, redness, or swelling where the shot was given, mild fever, headache, feeling tired, and nausea, vomiting, diarrhea, or stomachache sometimes happen after Tdap vaccination. People sometimes faint after medical procedures, including vaccination. Tell your provider if you feel dizzy or have vision changes or ringing in the ears.  As with any medicine, there is a very remote chance of a vaccine causing a severe allergic reaction, other serious injury, or death. 5. What if there is a serious problem? An allergic reaction could occur after the vaccinated person leaves the clinic. If you see signs of a severe allergic reaction (hives, swelling of the face and throat, difficulty breathing, a fast heartbeat, dizziness, or weakness), call 9-1-1 and get the person to the nearest hospital. For other signs that concern you, call your health care provider.  Adverse reactions should be reported to the Vaccine Adverse Event Reporting  System (VAERS). Your health care provider will usually file this report, or you can do it yourself. Visit the  VAERS website at www.vaers.LAgents.no or call 437-731-6503. VAERS is only for reporting reactions, and VAERS staff members do not give medical advice. 6. The National Vaccine Injury Compensation Program The [employer] Vaccine Injury Compensation Program (VICP) is a federal program that was created to compensate people who may have been injured by certain vaccines. Claims regarding alleged injury or death due to vaccination have a time limit for filing, which may be as short as two years. Visit the VICP website at SpiritualWord.at or call (669) 837-1631 to learn about the program and about filing a claim. 7. How can I learn more? Ask your health care provider. Call your local or state health department. Visit the website of the Food and Drug Administration (FDA) for vaccine package inserts and additional information at FinderList.no. Contact the Centers for Disease Control and Prevention (CDC): Call (484) 483-2759 (1-800-CDC-INFO) or Visit CDC's website at PicCapture.uy. Source: CDC Vaccine Information Statement Tdap (Tetanus, Diphtheria, Pertussis) Vaccine (05/07/2020) This same material is available at FootballExhibition.com.br for no charge. This information is not intended to replace advice given to you by your health care provider. Make sure you discuss any questions you have with your health care provider. Document Revised: 01/03/2023 Document Reviewed: 11/03/2022 Elsevier Patient Education  2024 [employer].

## 2023-09-17 NOTE — Progress Notes (Signed)
Routine Prenatal Care Visit  Subjective  Taylor Galvan is a 27 y.o. G2P1001 at [redacted]w[redacted]d being seen today for ongoing prenatal care.  She is currently monitored for the following issues for this low-risk pregnancy and has Rh negative state in antepartum period; Kidney stone complicating pregnancy, third trimester; Kidney stone; Anxiety; Supervision of other normal pregnancy, antepartum; and Chronic hypertension affecting pregnancy on their problem list.  ----------------------------------------------------------------------------------- Patient reports vomiting this morning after ingesting her glucola. This happened with her first pregnancy as well. She has also had numbness and tingling in her wrists. 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 Contractions: Not present. Vag. Bleeding: None.  Movement: Present. Leaking Fluid denies.  ----------------------------------------------------------------------------------- The following portions of the patient's history were reviewed and updated as appropriate: allergies, current medications, past family history, past medical history, past social history, past surgical history and problem list. Problem list updated.  Objective  Blood pressure 135/85, pulse 80, last menstrual period 02/27/2023. Pregravid weight 205 lb (93 kg) Total Weight Gain 17 lb 8 oz (7.938 kg) Urinalysis: Urine Protein    Urine Glucose    Fetal Status: Fetal Heart Rate (bpm): 143 Fundal Height: 28 cm Movement: Present     General:  Alert, oriented and cooperative. Patient is in no acute distress.  Skin: Skin is warm and dry. No rash noted.   Cardiovascular: Normal heart rate noted  Respiratory: Normal respiratory effort, no problems with respiration noted  Abdomen: Soft, gravid, appropriate for gestational age. Pain/Pressure: Absent     Pelvic:  Cervical exam deferred        Extremities: Normal range of motion.  Edema: None  Mental Status: Normal mood and affect. Normal behavior. Normal judgment  and thought content.   Assessment   27 y.o. G2P1001 at [redacted]w[redacted]d by  12/09/2023, by Ultrasound presenting for routine prenatal visit Unable to tolerate glucola beverage. Needs alternative option  for glucose test. Carpal tunnel Plan   second Problems (from 05/23/23 to present)     Problem Noted Diagnosed Resolved   Chronic hypertension affecting pregnancy 05/29/2023 by Dominica Severin, CNM  No   Overview Signed 08/24/2023  6:14 PM by Burney Gauze, CNM  Medication: Procardia 30 mg daily Plan: - q4wk growth [place] starting at 28 wks. - weekly NSTs starting at 32 wks - Daily FKC - Consider delivery between 37-39 weeks       Supervision of other normal pregnancy, antepartum 05/24/2023 by Loran Senters, CMA  No   Overview Addendum 09/17/2023 10:28 AM by Mirna Mires, CNM  Clinical Staff Provider  Office Location  [facility] Dating  Not found.  Language  English Anatomy US  Normal female  Flu Vaccine  offer Genetic Screen  NIPS: xx  TDaP vaccine   offer Hgb A1C or  GTT Early : Third trimester :   Covid declined   LAB RESULTS   Rhogam   09/17/23 Blood Type O/Negative/-- (08/26 1557)   Feeding Plan breast Antibody Negative (08/26 1557)  Contraception undecided Rubella 3.93 (08/26 1557)  Circumcision yes RPR Non Reactive (08/26 1557)   Pediatrician  [facility] HBsAg Negative (08/26 1557)   Support Person [place] HIV Non Reactive (08/26 1557)  Prenatal Classes no Varicella 2,372 (08/26 1557)    GBS  (For PCN allergy, check sensitivities)   BTL Consent  Hep C Non Reactive (08/26 1557)   VBAC Consent  Pap Diagnosis  Date Value Ref Range Status  06/21/2021   Final   - Negative for Intraepithelial Lesions  or Malignancy (NILM)  06/21/2021 - Benign reactive/reparative changes  Final      Hgb Electro      CF      SMA                    Preterm labor symptoms and general obstetric precautions including but not limited to vaginal bleeding, contractions, leaking of fluid and  fetal movement were reviewed in detail with the patient. Please refer to After Visit Summary for other counseling recommendations.  She is provided the list of alternative beverages and will RTC later this week to retest. Limited Zofran provided to take the morning of the test. Advised wrist splints at night.  Return in about 2 days (around 09/19/2023) for return for another attempt at doing her 28 week labs sometime this week.ROB in 2 wks...  Mirna Mires, CNM  09/17/2023 10:53 AM

## 2023-09-20 ENCOUNTER — Other Ambulatory Visit: Payer: 59

## 2023-09-20 DIAGNOSIS — Z113 Encounter for screening for infections with a predominantly sexual mode of transmission: Secondary | ICD-10-CM | POA: Diagnosis not present

## 2023-09-20 DIAGNOSIS — O26899 Other specified pregnancy related conditions, unspecified trimester: Secondary | ICD-10-CM | POA: Diagnosis not present

## 2023-09-20 DIAGNOSIS — Z6791 Unspecified blood type, Rh negative: Secondary | ICD-10-CM | POA: Diagnosis not present

## 2023-09-20 DIAGNOSIS — Z3480 Encounter for supervision of other normal pregnancy, unspecified trimester: Secondary | ICD-10-CM | POA: Diagnosis not present

## 2023-09-20 DIAGNOSIS — Z131 Encounter for screening for diabetes mellitus: Secondary | ICD-10-CM | POA: Diagnosis not present

## 2023-09-20 DIAGNOSIS — Z130 Encounter for screening for diseases of the blood and blood-forming organs and certain disorders involving the immune mechanism: Secondary | ICD-10-CM | POA: Diagnosis not present

## 2023-09-21 ENCOUNTER — Other Ambulatory Visit: Payer: Self-pay | Admitting: Obstetrics

## 2023-09-21 DIAGNOSIS — O219 Vomiting of pregnancy, unspecified: Secondary | ICD-10-CM

## 2023-09-21 MED ORDER — ONDANSETRON HCL 4 MG PO TABS: 4.0000 mg | ORAL_TABLET | Freq: Three times a day (TID) | ORAL | 1 refills | Status: DC | PRN

## 2023-09-28 LAB — 28 WEEKS RH-PANEL
Basophils Absolute: 0 10*3/uL (ref 0.0–0.2)
Basos: 0 %
EOS (ABSOLUTE): 0.1 10*3/uL (ref 0.0–0.4)
Eos: 1 %
Gestational Diabetes Screen: 98 mg/dL (ref 70–139)
HIV Screen 4th Generation wRfx: NONREACTIVE
Hematocrit: 39.5 % (ref 34.0–46.6)
Hemoglobin: 12.9 g/dL (ref 11.1–15.9)
Immature Grans (Abs): 0.1 10*3/uL (ref 0.0–0.1)
Immature Granulocytes: 1 %
Lymphocytes Absolute: 1.5 10*3/uL (ref 0.7–3.1)
Lymphs: 14 %
MCH: 30.4 pg (ref 26.6–33.0)
MCHC: 32.7 g/dL (ref 31.5–35.7)
MCV: 93 fL (ref 79–97)
Monocytes Absolute: 0.8 10*3/uL (ref 0.1–0.9)
Monocytes: 7 %
Neutrophils Absolute: 7.9 10*3/uL — ABNORMAL HIGH (ref 1.4–7.0)
Neutrophils: 77 %
Platelets: 230 10*3/uL (ref 150–450)
RBC: 4.25 x10E6/uL (ref 3.77–5.28)
RDW: 12.6 % (ref 11.7–15.4)
RPR Ser Ql: NONREACTIVE
WBC: 10.5 10*3/uL (ref 3.4–10.8)

## 2023-09-28 LAB — AB SCR+ANTIBODY ID: Antibody Screen: POSITIVE — AB

## 2023-10-02 ENCOUNTER — Ambulatory Visit (INDEPENDENT_AMBULATORY_CARE_PROVIDER_SITE_OTHER): Payer: 59 | Admitting: Obstetrics

## 2023-10-02 VITALS — BP 135/88 | HR 95 | Wt 227.1 lb

## 2023-10-02 DIAGNOSIS — Z3480 Encounter for supervision of other normal pregnancy, unspecified trimester: Secondary | ICD-10-CM

## 2023-10-02 DIAGNOSIS — Z3A30 30 weeks gestation of pregnancy: Secondary | ICD-10-CM

## 2023-10-02 LAB — POCT URINALYSIS DIPSTICK
Bilirubin, UA: NEGATIVE
Blood, UA: NEGATIVE
Glucose, UA: NEGATIVE
Ketones, UA: NEGATIVE
Leukocytes, UA: NEGATIVE
Nitrite, UA: NEGATIVE
Protein, UA: NEGATIVE
Spec Grav, UA: 1.015 (ref 1.010–1.025)
Urobilinogen, UA: 0.2 U/dL
pH, UA: 6.5 (ref 5.0–8.0)

## 2023-10-02 NOTE — Progress Notes (Signed)
 Routine Prenatal Care Visit  Subjective  Taylor Galvan is a 27 y.o. G2P1001 at [redacted]w[redacted]d being seen today for ongoing prenatal care.  She is currently monitored for the following issues for this low-risk pregnancy and has Rh negative state in antepartum period; Kidney stone complicating pregnancy, third trimester; Kidney stone; Anxiety; Supervision of other normal pregnancy, antepartum; and Chronic hypertension affecting pregnancy on their problem list.  ----------------------------------------------------------------------------------- Patient reports no complaints.   Contractions: Not present. Vag. Bleeding: None.  Movement: Present. Leaking Fluid denies.  ----------------------------------------------------------------------------------- The following portions of the patient's history were reviewed and updated as appropriate: allergies, current medications, past family history, past medical history, past social history, past surgical history and problem list. Problem list updated.  Objective  Blood pressure 135/88, pulse 95, weight 227 lb 1.6 oz (103 kg), last menstrual period 02/27/2023. Pregravid weight 205 lb (93 kg) Total Weight Gain 22 lb 1.6 oz (10 kg) Urinalysis: Urine Protein    Urine Glucose    Fetal Status: Fetal Heart Rate (bpm): 145 Fundal Height: 32 cm Movement: Present     General:  Alert, oriented and cooperative. Patient is in no acute distress.  Skin: Skin is warm and dry. No rash noted.   Cardiovascular: Normal heart rate noted  Respiratory: Normal respiratory effort, no problems with respiration noted  Abdomen: Soft, gravid, appropriate for gestational age. Pain/Pressure: Absent     Pelvic:  Cervical exam deferred        Extremities: Normal range of motion.  Edema: Mild pitting, slight indentation  Mental Status: Normal mood and affect. Normal behavior. Normal judgment and thought content.   Assessment   27 y.o. G2P1001 at [redacted]w[redacted]d by  12/09/2023, by Ultrasound presenting  for routine prenatal visit  Plan   second Problems (from 05/23/23 to present)     Problem Noted Diagnosed Resolved   Chronic hypertension affecting pregnancy 05/29/2023 by Jayne Harlene CROME, CNM  No   Overview Signed 08/24/2023  6:14 PM by Rutherford Lauraine PARAS, CNM  Medication: Procardia  30 mg daily Plan: - q4wk growth US  starting at 28 wks. - weekly NSTs starting at 32 wks - Daily FKC - Consider delivery between 37-39 weeks       Supervision of other normal pregnancy, antepartum 05/24/2023 by Vicci Levan, CMA  No   Overview Addendum 09/17/2023 10:59 AM by Kizzie Camelia CROME, CMA  Clinical Staff Provider  Office Location  [facility] Dating  Not found.  Language  English Anatomy US   Normal female  Flu Vaccine  offer Genetic Screen  NIPS: xx  TDaP vaccine  09/17/23 Hgb A1C or  GTT Early : Third trimester :   Covid declined   LAB RESULTS   Rhogam   09/17/23 Blood Type O/Negative/-- (08/26 1557)   Feeding Plan breast Antibody Negative (08/26 1557)  Contraception undecided Rubella 3.93 (08/26 1557)  Circumcision yes RPR Non Reactive (08/26 1557)   Pediatrician  [facility] HBsAg Negative (08/26 1557)   Support Person [place] HIV Non Reactive (08/26 1557)  Prenatal Classes no Varicella 2,372 (08/26 1557)    GBS  (For PCN allergy, check sensitivities)   BTL Consent  Hep C Non Reactive (08/26 1557)   VBAC Consent  Pap Diagnosis  Date Value Ref Range Status  06/21/2021   Final   - Negative for Intraepithelial Lesions or Malignancy (NILM)  06/21/2021 - Benign reactive/reparative changes  Final    BTC form 09/17/23  Hgb Electro      CF  SMA                    Preterm labor symptoms and general obstetric precautions including but not limited to vaginal bleeding, contractions, leaking of fluid and fetal movement were reviewed in detail with the patient. Please refer to After Visit Summary for other counseling recommendations.  Reviewed her 28 week labs.   Return in about  2 weeks (around 10/16/2023) for return OB.  Rollene CHRISTELLA Ro, CNM  10/02/2023 11:57 AM

## 2023-10-03 NOTE — L&D Delivery Note (Signed)
 Delivery Note   Taylor Galvan is a 28 y.o. G2P1001 at [redacted]w[redacted]d Estimated Date of Delivery: 12/09/23  PRE-OPERATIVE DIAGNOSIS:  1) [redacted]w[redacted]d pregnancy.  2) cHTN on nifedipine  POST-OPERATIVE DIAGNOSIS:  1) [redacted]w[redacted]d pregnancy s/p Vaginal, Spontaneous Same, delivered  Delivery Type: Vaginal, Spontaneous   Delivery Anesthesia: Epidural  Labor Complications:  FHR decelerations    ESTIMATED BLOOD LOSS: 150 ml    FINDINGS:   1) female infant, Apgar scores of 7   at 1 minute and 9   at 5 minutes and a birthweight pending per protocol   SPECIMENS:   PLACENTA:   Appearance: Intact   Removal: Spontaneous     Disposition:  discarded  CORD BLOOD: collected for typing  DISPOSITION:  Infant left in stable condition in the delivery room, with L&D personnel and mother,  NARRATIVE SUMMARY: Labor course:  Taylor Galvan is a G2P1001 at [redacted]w[redacted]d who presented to Labor & Delivery for induction of labor due to chronic hypertension. Her initial cervical exam was closed/posterioi. Labor induced with misoprostol & pitocin. SROM, clear at 0300. After epidural prolonged FHR deceleration noted, IUPC placed and amnioinfusion initiated. Continued to have variable FHR decelerations throughout labor. Pitocin discontinued for 51m due to FHR decelerations. Once in active labor normal progression noted. She was found to be completely dilated at 1224. With excellent maternal pushing effort, she birthed a viable female infant at 78. There was not a nuchal cord. The shoulders were birthed without difficulty. The infant was placed skin-to-skin with mother. The cord was doubly clamped and cut by FOB when pulsations ceased. The placenta delivered spontaneously and was noted to be intact with a 3VC. A perineal and vaginal examination was performed. Episiotomy/Lacerations: None The patient tolerated this well. Mother and baby were left in stable condition.   Taylor Galvan, CNM 12/02/2023 1:45 PM

## 2023-10-15 ENCOUNTER — Ambulatory Visit (INDEPENDENT_AMBULATORY_CARE_PROVIDER_SITE_OTHER): Payer: 59 | Admitting: Certified Nurse Midwife

## 2023-10-15 VITALS — BP 114/67 | HR 102 | Wt 228.4 lb

## 2023-10-15 DIAGNOSIS — O10919 Unspecified pre-existing hypertension complicating pregnancy, unspecified trimester: Secondary | ICD-10-CM

## 2023-10-15 DIAGNOSIS — Z3480 Encounter for supervision of other normal pregnancy, unspecified trimester: Secondary | ICD-10-CM

## 2023-10-15 DIAGNOSIS — Z3A32 32 weeks gestation of pregnancy: Secondary | ICD-10-CM | POA: Diagnosis not present

## 2023-10-15 DIAGNOSIS — O26899 Other specified pregnancy related conditions, unspecified trimester: Secondary | ICD-10-CM

## 2023-10-15 DIAGNOSIS — O10013 Pre-existing essential hypertension complicating pregnancy, third trimester: Secondary | ICD-10-CM

## 2023-10-15 LAB — POCT URINALYSIS DIPSTICK
Bilirubin, UA: NEGATIVE
Blood, UA: NEGATIVE
Glucose, UA: NEGATIVE
Ketones, UA: NEGATIVE
Leukocytes, UA: NEGATIVE
Nitrite, UA: NEGATIVE
Protein, UA: NEGATIVE
Spec Grav, UA: 1.015 (ref 1.010–1.025)
Urobilinogen, UA: 0.2 U/dL
pH, UA: 6.5 (ref 5.0–8.0)

## 2023-10-15 NOTE — Progress Notes (Signed)
    Return Prenatal Note   Subjective   28 y.o. G2P1001 at [redacted]w[redacted]d presents for this follow-up prenatal visit.  Patient feeling well, active baby. Initial BP elevated, repeat normotensive. Denies headache, visual changes, pain. Continue daily Procardia . NST today, reviewed indication as well as order for growth ultrasound. Patient reports: Movement: Present Contractions: Not present  Objective   Flow sheet Vitals: Pulse Rate: (!) 102 BP: 114/67 Fundal Height: 32 cm Fetal Heart Rate (bpm): 135 Total weight gain: 23 lb 6.4 oz (10.6 kg)  General Appearance  No acute distress, well appearing, and well nourished Pulmonary   Normal work of breathing Neurologic   Alert and oriented to person, place, and time Psychiatric   Mood and affect within normal limits  Assessment/Plan   Plan  28 y.o. G2P1001 at [redacted]w[redacted]d presents for follow-up OB visit. Reviewed prenatal record including previous visit note.  Chronic hypertension affecting pregnancy RNST today Growth ultrasound ordered Reviewed NSTs weekly, delivery likely 39w. Continue daily Procardia   Supervision of other normal pregnancy, antepartum Reviewed kick counts and preterm labor warning signs. Instructed to call office or come to hospital with persistent headache, vision changes, regular contractions, leaking of fluid, decreased fetal movement or vaginal bleeding. Declines RSV vaccine.      Orders Placed This Encounter  Procedures   US  OB Follow Up    Prefers [facility], not in clinic    Standing Status:   Future    Expected Date:   10/22/2023    Expiration Date:   01/13/2024    Reason for exam::   CHTN in pregnancy, follow up growth & BPP    Preferred imaging location?:   [facility]   POCT Urinalysis Dipstick   Return in 1 week (on 10/22/2023) for ROB & NST.   No future appointments.  For next visit:  ROB with NST     Harlene LITTIE Cisco, CNM  10/14/2510:04 PM

## 2023-10-15 NOTE — Patient Instructions (Signed)

## 2023-10-15 NOTE — Assessment & Plan Note (Signed)
 Reviewed kick counts and preterm labor warning signs. Instructed to call office or come to hospital with persistent headache, vision changes, regular contractions, leaking of fluid, decreased fetal movement or vaginal bleeding. Declines RSV vaccine.

## 2023-10-15 NOTE — Assessment & Plan Note (Signed)
 RNST today Growth ultrasound ordered Reviewed NSTs weekly, delivery likely 39w. Continue daily Procardia

## 2023-10-16 ENCOUNTER — Encounter: Payer: Self-pay | Admitting: Certified Nurse Midwife

## 2023-10-16 ENCOUNTER — Other Ambulatory Visit: Payer: Self-pay | Admitting: Certified Nurse Midwife

## 2023-10-16 DIAGNOSIS — O10919 Unspecified pre-existing hypertension complicating pregnancy, unspecified trimester: Secondary | ICD-10-CM

## 2023-10-17 ENCOUNTER — Ambulatory Visit
Admission: RE | Admit: 2023-10-17 | Discharge: 2023-10-17 | Disposition: A | Discharge status: Home / Self Care | Payer: 59 | Source: Ambulatory Visit | Attending: Certified Nurse Midwife | Admitting: Certified Nurse Midwife

## 2023-10-17 DIAGNOSIS — O10919 Unspecified pre-existing hypertension complicating pregnancy, unspecified trimester: Secondary | ICD-10-CM | POA: Insufficient documentation

## 2023-10-17 DIAGNOSIS — O10013 Pre-existing essential hypertension complicating pregnancy, third trimester: Secondary | ICD-10-CM | POA: Diagnosis not present

## 2023-10-17 DIAGNOSIS — Z3A32 32 weeks gestation of pregnancy: Secondary | ICD-10-CM | POA: Diagnosis not present

## 2023-10-19 ENCOUNTER — Other Ambulatory Visit: Payer: Self-pay

## 2023-10-19 ENCOUNTER — Telehealth: Payer: Self-pay | Admitting: Certified Nurse Midwife

## 2023-10-19 ENCOUNTER — Observation Stay
Admission: EM | Admit: 2023-10-19 | Discharge: 2023-10-19 | Disposition: A | Discharge status: Home / Self Care | Payer: 59 | Attending: Obstetrics and Gynecology | Admitting: Obstetrics and Gynecology

## 2023-10-19 ENCOUNTER — Encounter: Payer: Self-pay | Admitting: Certified Nurse Midwife

## 2023-10-19 DIAGNOSIS — O10013 Pre-existing essential hypertension complicating pregnancy, third trimester: Principal | ICD-10-CM

## 2023-10-19 DIAGNOSIS — O10919 Unspecified pre-existing hypertension complicating pregnancy, unspecified trimester: Secondary | ICD-10-CM | POA: Diagnosis present

## 2023-10-19 DIAGNOSIS — Z3A32 32 weeks gestation of pregnancy: Secondary | ICD-10-CM

## 2023-10-19 DIAGNOSIS — Z79899 Other long term (current) drug therapy: Secondary | ICD-10-CM | POA: Insufficient documentation

## 2023-10-19 DIAGNOSIS — Z3480 Encounter for supervision of other normal pregnancy, unspecified trimester: Principal | ICD-10-CM

## 2023-10-19 DIAGNOSIS — Z3689 Encounter for other specified antenatal screening: Secondary | ICD-10-CM | POA: Diagnosis not present

## 2023-10-19 DIAGNOSIS — O288 Other abnormal findings on antenatal screening of mother: Secondary | ICD-10-CM | POA: Insufficient documentation

## 2023-10-19 NOTE — Telephone Encounter (Signed)
LVM requesting patient come in for NST over the weekend due to BPP of 6/8.

## 2023-10-19 NOTE — Discharge Summary (Signed)
LABOR & DELIVERY OB TRIAGE NOTE  SUBJECTIVE  HPI Taylor Galvan is a 28 y.o. G2P1001 at [redacted]w[redacted]d who presents to Labor & Delivery for NST after BPP of 6/8 10/17/23, off for breathing. She was requested to come to L&D for NST given equivocal testing. Pregnancy complicated by [facility] for which she is receiving antenatal testing. Endorses normal fetal movement, denies contractions, loss of fluid or vaginal bleeding.  OB History     Gravida  2   Para  1   Term  1   Preterm  0   AB  0   Living  1      SAB  0   IAB  0   Ectopic  0   Multiple  0   Live Births  1            OBJECTIVE  BP 138/71   Pulse 92   Temp 98.3 F (36.8 C) (Oral)   Resp 18   Ht 5\' 9"  (1.753 m)   Wt 103.4 kg   LMP 02/27/2023 (Approximate)   BMI 33.67 kg/m   General: A&Ox4, NAD Heart: regular rate Lungs: normal work of breathing Abdomen: gravid, soft, nontender Cervical exam:   deferred  NST I reviewed the NST and it was reactive.  Baseline: 140 Variability: moderate Accelerations: present Decelerations:none Toco: quiet, no contractions noted Category I  ASSESSMENT Impression  1) Pregnancy at G2P1001, [redacted]w[redacted]d, Estimated Date of Delivery: 12/09/23 2) Reassuring maternal/fetal status with RNST  PLAN NST today reactive. Discharge home with preterm labor & fetal movement precautions. Continue with routine prenatal care with weekly antenatal testing given well controlled cHTN on nifedipine. NST & ROB scheduled for Monday at 1015. Dominica Severin, CNM 10/19/23  3:00 PM

## 2023-10-19 NOTE — OB Triage Note (Signed)

## 2023-10-19 NOTE — Progress Notes (Signed)
Pt presents to L/D triage per OB recommendation for follow-up NST post-BPP (6/8). Pt reports no pain, bleeding, or LOF. Monitors applied and assessing.  Initial FHT 145.VSS.

## 2023-10-22 ENCOUNTER — Ambulatory Visit (INDEPENDENT_AMBULATORY_CARE_PROVIDER_SITE_OTHER): Payer: 59

## 2023-10-22 ENCOUNTER — Encounter: Payer: Self-pay | Admitting: Licensed Practical Nurse

## 2023-10-22 ENCOUNTER — Ambulatory Visit (INDEPENDENT_AMBULATORY_CARE_PROVIDER_SITE_OTHER): Payer: 59 | Admitting: Licensed Practical Nurse

## 2023-10-22 VITALS — BP 119/73 | HR 91 | Ht 69.0 in | Wt 231.2 lb

## 2023-10-22 VITALS — BP 119/73 | HR 91 | Wt 231.2 lb

## 2023-10-22 DIAGNOSIS — Z3A33 33 weeks gestation of pregnancy: Secondary | ICD-10-CM | POA: Diagnosis not present

## 2023-10-22 DIAGNOSIS — Z3480 Encounter for supervision of other normal pregnancy, unspecified trimester: Secondary | ICD-10-CM

## 2023-10-22 DIAGNOSIS — O10013 Pre-existing essential hypertension complicating pregnancy, third trimester: Secondary | ICD-10-CM

## 2023-10-22 DIAGNOSIS — O10913 Unspecified pre-existing hypertension complicating pregnancy, third trimester: Secondary | ICD-10-CM

## 2023-10-22 LAB — POCT URINALYSIS DIPSTICK
Bilirubin, UA: NEGATIVE
Blood, UA: POSITIVE
Glucose, UA: NEGATIVE
Ketones, UA: NEGATIVE
Leukocytes, UA: NEGATIVE
Nitrite, UA: NEGATIVE
Protein, UA: POSITIVE — AB
Spec Grav, UA: 1.015 (ref 1.010–1.025)
Urobilinogen, UA: 0.2 U/dL
pH, UA: 7.5 (ref 5.0–8.0)

## 2023-10-22 NOTE — Assessment & Plan Note (Addendum)
NST today reactive Reviewed FKC Reviewed s/s of pre-eclampsia.

## 2023-10-22 NOTE — Progress Notes (Signed)
    Return Prenatal Note   Subjective   28 y.o. G2P1001 at [redacted]w[redacted]d presents for this follow-up prenatal visit.  Taylor Galvan is feeling good fetal movement. She reports feeling a bit more tired as her pregnancy progresses but is able to rest. She feels her mood is stable on her current dose of Zoloft.   Movement: Present Contractions: Not present  Objective   Flow sheet Vitals: Pulse Rate: 91 BP: 119/73 Total weight gain: 11.9 kg  General Appearance  No acute distress, well appearing, and well nourished Pulmonary   Normal work of breathing Neurologic   Alert and oriented to person, place, and time Psychiatric   Mood and affect within normal limits  Assessment/Plan   Plan  28 y.o. G2P1001 at [redacted]w[redacted]d presents for follow-up OB visit. Reviewed prenatal record including previous visit note.  Maternal chronic hypertension, third trimester NST today reactive Reviewed FKC Reviewed s/s of pre-eclampsia.     Supervision of other normal pregnancy, antepartum Reviewed third trimester precautions and when to call.  Discussed anticipated IOL timing.  Anticipatory guidance provided regarding postpartum course and postpartum depression.       Orders Placed This Encounter  Procedures   POCT Urinalysis Dipstick   Return in about 2 weeks (around 11/05/2023) for NSt in 1 week, ROB/NST in 2 weeks .   Future Appointments  Date Time Provider Department Center  10/29/2023 10:15 AM AOB-NST ROOM [facility] None  11/05/2023 10:15 AM AOB-NST ROOM [facility] None  11/05/2023 11:15 AM Free, Lindalou Hose, CNM [facility] None    For next visit:  continue with routine prenatal care and weekly NSTs     Taylor Galvan, Student-MidWife  01/20/254:06 PM

## 2023-10-22 NOTE — Assessment & Plan Note (Addendum)
Reviewed third trimester precautions and when to call.  Discussed anticipated IOL timing.  Anticipatory guidance provided regarding postpartum course and postpartum depression.

## 2023-10-22 NOTE — Patient Instructions (Signed)

## 2023-10-22 NOTE — Progress Notes (Signed)
    NURSE VISIT NOTE  Subjective:    Patient ID: Taylor Galvan, female    DOB: 01-16-1996, 28 y.o.   MRN: [id number]  HPI  Patient is a 28 y.o. G72P1001 female who presents for fetal monitoring per order from Guadlupe Spanish, CNM.   Objective:    BP 119/73   Pulse 91   Ht 5\' 9"  (1.753 m)   Wt 231 lb 3.2 oz (104.9 kg)   LMP 02/27/2023 (Approximate)   BMI 34.14 kg/m  Estimated Date of Delivery: 12/09/23  [redacted]w[redacted]d  Fetus A Non-Stress Test Interpretation for 10/22/23  Indication: Chronic Hypertenstion  Fetal Heart Rate A Mode: External Baseline Rate (A): 135 bpm Variability: Moderate Accelerations: 15 x 15 Decelerations: None Multiple birth?: No  Uterine Activity Mode: Toco Contraction Frequency (min): None  Interpretation (Fetal Testing) Nonstress Test Interpretation: Reactive Overall Impression: Reassuring for gestational age   Assessment:   1. Maternal chronic hypertension, third trimester   2. [redacted] weeks gestation of pregnancy      Plan:   Results reviewed and discussed with patient by  Carie Caddy, CNM.     Rocco Serene, LPN

## 2023-10-29 ENCOUNTER — Telehealth: Payer: Self-pay

## 2023-10-29 ENCOUNTER — Other Ambulatory Visit: Payer: 59

## 2023-10-29 DIAGNOSIS — Z3A34 34 weeks gestation of pregnancy: Secondary | ICD-10-CM | POA: Insufficient documentation

## 2023-10-29 NOTE — Progress Notes (Deleted)
 [name] Kitchen

## 2023-10-29 NOTE — Telephone Encounter (Signed)
Reached out to pt about NST that was scheduled on 10/29/2023 at 10:15.  Left message for pt to call back to reschedule.

## 2023-10-29 NOTE — Telephone Encounter (Signed)
No DPR. Left voicemail to return call.

## 2023-10-30 ENCOUNTER — Encounter: Payer: Self-pay | Admitting: Obstetrics and Gynecology

## 2023-10-30 NOTE — Telephone Encounter (Signed)
Reached out to pt (2x) about NST appt that was scheduled on 10/29/2023 at 10:15.  Left message for pt to call back.  Will send a MyChart letter.

## 2023-11-05 ENCOUNTER — Ambulatory Visit (INDEPENDENT_AMBULATORY_CARE_PROVIDER_SITE_OTHER): Payer: 59

## 2023-11-05 VITALS — BP 123/85 | HR 97 | Wt 230.0 lb

## 2023-11-05 VITALS — BP 123/85 | HR 97 | Ht 69.0 in | Wt 230.0 lb

## 2023-11-05 DIAGNOSIS — Z3A35 35 weeks gestation of pregnancy: Secondary | ICD-10-CM

## 2023-11-05 DIAGNOSIS — O10913 Unspecified pre-existing hypertension complicating pregnancy, third trimester: Secondary | ICD-10-CM

## 2023-11-05 DIAGNOSIS — O10013 Pre-existing essential hypertension complicating pregnancy, third trimester: Secondary | ICD-10-CM | POA: Diagnosis not present

## 2023-11-05 DIAGNOSIS — Z3480 Encounter for supervision of other normal pregnancy, unspecified trimester: Secondary | ICD-10-CM

## 2023-11-05 NOTE — Assessment & Plan Note (Signed)
Anticipatory guidance given for 36 week labs.  Reviewed labor warning signs and expectations for birth. Instructed to call office or come to hospital with persistent headache, vision changes, regular contractions, leaking of fluid, decreased fetal movement or vaginal bleeding.

## 2023-11-05 NOTE — Progress Notes (Unsigned)
    NURSE VISIT NOTE  Subjective:    Patient ID: Taylor Galvan, female    DOB: Nov 15, 1995, 28 y.o.   MRN: [id number]  HPI  Patient is a 28 y.o. G68P1001 female who presents for fetal monitoring per order from Carie Caddy, [place].   Objective:    LMP 02/27/2023 (Approximate)  Estimated Date of Delivery: 12/09/23  [redacted]w[redacted]d  Fetus A Non-Stress Test Interpretation for 11/05/23  Indication: Chronic Hypertenstion            Assessment:   1. Maternal chronic hypertension, third trimester   2. [redacted] weeks gestation of pregnancy      Plan:   Results reviewed and discussed with patient by  Autumn Messing, CNM.     Rocco Serene, LPN

## 2023-11-05 NOTE — Progress Notes (Signed)
    Return Prenatal Note   Assessment/Plan   Plan  28 y.o. G2P1001 at [redacted]w[redacted]d presents for follow-up OB visit. Reviewed prenatal record including previous visit note.  Maternal chronic hypertension, third trimester Normotensive today without pre-eclampsia symptoms Reactive NST today. Active baby. Orders placed for NST next week and BPP/Growth in 2 weeks.  Questions answered for IOL plan  Supervision of other normal pregnancy, antepartum Anticipatory guidance given for 36 week labs.  Reviewed labor warning signs and expectations for birth. Instructed to call office or come to hospital with persistent headache, vision changes, regular contractions, leaking of fluid, decreased fetal movement or vaginal bleeding.    Orders Placed This Encounter  Procedures   US OB Follow Up    Standing Status:   Future    Expected Date:   11/19/2023    Expiration Date:   02/02/2024    Reason for exam::   CTHN    Preferred imaging location?:   Internal   US FETAL BPP WO NON STRESS    Standing Status:   Future    Expected Date:   11/19/2023    Expiration Date:   02/02/2024    Reason for exam::   CHTN    Preferred imaging location?:   Internal   No follow-ups on file.   Future Appointments  Date Time Provider Department Center  11/12/2023 10:15 AM AOB-NST ROOM AOB-AOB None  11/12/2023 10:55 AM Glenetta Borg, CNM AOB-AOB None    For next visit:  ROB with GBS screening  and NST     Subjective   28 y.o. G2P1001 at [redacted]w[redacted]d presents for this follow-up prenatal visit.  Patient reports: no complaints Movement: Present Contractions: Not present  Objective   Flow sheet Vitals: Pulse Rate: 97 BP: 123/85 Fundal Height: 36 cm Fetal Heart Rate (bpm): 130 Total weight gain: 25 lb (11.3 kg)  General Appearance  No acute distress, well appearing, and well nourished Pulmonary   Normal work of breathing Neurologic   Alert and oriented to person, place, and time Psychiatric   Mood and affect within  normal limits  Oley Balm, CNM  11/04/2509:22 AM

## 2023-11-05 NOTE — Patient Instructions (Signed)

## 2023-11-05 NOTE — Assessment & Plan Note (Addendum)
Normotensive today without pre-eclampsia symptoms Reactive NST today. Active baby. Orders placed for NST next week and BPP/Growth in 2 weeks.  Questions answered for IOL plan

## 2023-11-12 ENCOUNTER — Other Ambulatory Visit (HOSPITAL_COMMUNITY)
Admission: RE | Admit: 2023-11-12 | Discharge: 2023-11-12 | Disposition: A | Discharge status: Home / Self Care | Payer: 59 | Source: Ambulatory Visit | Attending: Obstetrics | Admitting: Obstetrics

## 2023-11-12 ENCOUNTER — Ambulatory Visit (INDEPENDENT_AMBULATORY_CARE_PROVIDER_SITE_OTHER): Payer: 59

## 2023-11-12 ENCOUNTER — Ambulatory Visit (INDEPENDENT_AMBULATORY_CARE_PROVIDER_SITE_OTHER): Payer: 59 | Admitting: Obstetrics

## 2023-11-12 VITALS — BP 113/78 | HR 80 | Ht 69.0 in | Wt 232.0 lb

## 2023-11-12 VITALS — BP 113/78 | HR 80 | Wt 232.0 lb

## 2023-11-12 DIAGNOSIS — O99343 Other mental disorders complicating pregnancy, third trimester: Secondary | ICD-10-CM | POA: Diagnosis not present

## 2023-11-12 DIAGNOSIS — O10013 Pre-existing essential hypertension complicating pregnancy, third trimester: Secondary | ICD-10-CM | POA: Diagnosis not present

## 2023-11-12 DIAGNOSIS — Z3A36 36 weeks gestation of pregnancy: Secondary | ICD-10-CM

## 2023-11-12 DIAGNOSIS — Z3480 Encounter for supervision of other normal pregnancy, unspecified trimester: Secondary | ICD-10-CM | POA: Insufficient documentation

## 2023-11-12 DIAGNOSIS — O10919 Unspecified pre-existing hypertension complicating pregnancy, unspecified trimester: Secondary | ICD-10-CM | POA: Diagnosis not present

## 2023-11-12 DIAGNOSIS — F419 Anxiety disorder, unspecified: Secondary | ICD-10-CM | POA: Diagnosis not present

## 2023-11-12 DIAGNOSIS — O10913 Unspecified pre-existing hypertension complicating pregnancy, third trimester: Secondary | ICD-10-CM

## 2023-11-12 NOTE — Assessment & Plan Note (Addendum)
-  Mood is stable -Continue Zoloft  100 mg Daily

## 2023-11-12 NOTE — Assessment & Plan Note (Signed)
-   Anticipatory guidance reviewed, labor s/s reviewed and when to present to hospital.  - Danger signs reviewed.

## 2023-11-12 NOTE — Progress Notes (Signed)
    NURSE VISIT NOTE  Subjective:    Patient ID: MORJORIE KONOPA, female    DOB: 04/17/1996, 28 y.o.   MRN: [id number]  HPI  Patient is a 28 y.o. G60P1001 female who presents for fetal monitoring per order from Fred Jacobsen, [place].   Objective:    BP 113/78   Pulse 80   Ht 5\' 9"  (1.753 m)   Wt 232 lb (105.2 kg)   LMP 02/27/2023 (Approximate)   BMI 34.26 kg/m  Estimated Date of Delivery: 12/09/23  [redacted]w[redacted]d  Fetus A Non-Stress Test Interpretation for 11/12/23  Indication: Chronic Hypertenstion  Fetal Heart Rate A Mode: External Baseline Rate (A): 135 bpm Variability: Moderate Accelerations: 15 x 15 Decelerations: None Multiple birth?: No  Uterine Activity Mode: Toco Contraction Frequency (min): None  Interpretation (Fetal Testing) Nonstress Test Interpretation: Reactive Overall Impression: Reassuring for gestational age   Assessment:   1. Maternal chronic hypertension, third trimester   2. [redacted] weeks gestation of pregnancy      Plan:   Results reviewed and discussed with patient by  Josue Nip, CNM.     Desmond Florida, LPN

## 2023-11-12 NOTE — Assessment & Plan Note (Addendum)
-  Continue on Procardia  30 mg daily -Normotensive today.  -Reactive NST today. Continue weekly NSTs  -Growth US /BPP scheduled for next week.  -Induction discussed and will be scheduled at next appointment.

## 2023-11-12 NOTE — Progress Notes (Addendum)
    Return Prenatal Note   Assessment/Plan   Plan  28 y.o. G2P1001 at [redacted]w[redacted]d presents for follow-up OB visit. Reviewed prenatal record including previous visit note.  Anxiety -Mood is stable -Continue Zoloft  100 mg Daily    Maternal chronic hypertension, third trimester -Continue on Procardia  30 mg daily -Normotensive today.  -Reactive NST today. Continue weekly NSTs  -Growth US /BPP scheduled for next week.  -Induction discussed and will be scheduled at next appointment.    Supervision of other normal pregnancy, antepartum - Anticipatory guidance reviewed, labor s/s reviewed and when to present to hospital.  - Danger signs reviewed.    No orders of the defined types were placed in this encounter.  No follow-ups on file.   Future Appointments  Date Time Provider Department Center  11/20/2023 10:30 AM AOB-AOB US  1 AOB-IMG None  11/20/2023  3:15 PM Zenobia Hila, MD AOB-A[facility] None  11/26/2023 10:15 AM AOB-NST ROOM AOB-AOB None  11/26/2023 10:55 AM Phylliss Brenner, CNM AOB-AOB None  12/03/2023 10:15 AM AOB-NST ROOM AOB-AOB None  12/03/2023 10:55 AM Dominic, Alva Jewels, CNM AOB-AOB None    For next visit:  Routine prenatal care    Subjective  Feeling well overall, feels good on the Zoloft . Feeling ready for labor, has childcare for when she goes into labor.   Movement: Present Contractions: Not present  Objective   Flow sheet Vitals: Pulse Rate: 80 BP: 113/78 Fundal Height: 36 cm Total weight gain: 27 lb (12.2 kg)  General Appearance  No acute distress, well appearing, and well nourished Pulmonary   Normal work of breathing Neurologic   Alert and oriented to person, place, and time Psychiatric   Mood and affect within normal limits  Josue Nip, CNM 11/12/23 11:36 AM

## 2023-11-12 NOTE — Addendum Note (Signed)
 Addended by: Abram Abraham on: 11/12/2023 11:52 AM   Modules accepted: Orders

## 2023-11-12 NOTE — Patient Instructions (Signed)

## 2023-11-13 LAB — CERVICOVAGINAL ANCILLARY ONLY
Chlamydia: NEGATIVE
Comment: NEGATIVE
Comment: NORMAL
Neisseria Gonorrhea: NEGATIVE

## 2023-11-14 LAB — STREP GP B NAA: Strep Gp B NAA: NEGATIVE

## 2023-11-20 ENCOUNTER — Ambulatory Visit (INDEPENDENT_AMBULATORY_CARE_PROVIDER_SITE_OTHER): Payer: 59 | Admitting: Obstetrics and Gynecology

## 2023-11-20 ENCOUNTER — Other Ambulatory Visit: Payer: Self-pay | Admitting: Certified Nurse Midwife

## 2023-11-20 ENCOUNTER — Encounter: Payer: Self-pay | Admitting: Obstetrics and Gynecology

## 2023-11-20 ENCOUNTER — Ambulatory Visit (INDEPENDENT_AMBULATORY_CARE_PROVIDER_SITE_OTHER): Payer: 59

## 2023-11-20 VITALS — BP 120/79 | HR 92 | Wt 233.5 lb

## 2023-11-20 DIAGNOSIS — E669 Obesity, unspecified: Secondary | ICD-10-CM | POA: Diagnosis not present

## 2023-11-20 DIAGNOSIS — Z3480 Encounter for supervision of other normal pregnancy, unspecified trimester: Secondary | ICD-10-CM

## 2023-11-20 DIAGNOSIS — Z3A37 37 weeks gestation of pregnancy: Secondary | ICD-10-CM

## 2023-11-20 DIAGNOSIS — O10913 Unspecified pre-existing hypertension complicating pregnancy, third trimester: Secondary | ICD-10-CM

## 2023-11-20 DIAGNOSIS — O10013 Pre-existing essential hypertension complicating pregnancy, third trimester: Secondary | ICD-10-CM | POA: Diagnosis not present

## 2023-11-20 DIAGNOSIS — Z3A36 36 weeks gestation of pregnancy: Secondary | ICD-10-CM | POA: Diagnosis not present

## 2023-11-20 DIAGNOSIS — O99213 Obesity complicating pregnancy, third trimester: Secondary | ICD-10-CM | POA: Diagnosis not present

## 2023-11-20 DIAGNOSIS — O10919 Unspecified pre-existing hypertension complicating pregnancy, unspecified trimester: Secondary | ICD-10-CM

## 2023-11-20 NOTE — Progress Notes (Signed)
 ROB: 37 weeks and 2 days.  Has no complaints.  8/8 BPP today.  Chronic hypertension controlled well with 30 mg Procardia.  Previous vacuum vaginal birth. Induction scheduled for 3/1-8 AM. (No other times available earlier that week)

## 2023-11-20 NOTE — Progress Notes (Signed)
 ROB. She states daily fetal movement. BPP 8/8 today. Continuing to take Procardia 30 mg daily along with ASA. Questions regarding induction due to hypertension.

## 2023-11-21 ENCOUNTER — Encounter: Payer: Self-pay | Admitting: Obstetrics and Gynecology

## 2023-11-26 ENCOUNTER — Encounter: Payer: Self-pay | Admitting: Obstetrics

## 2023-11-26 ENCOUNTER — Ambulatory Visit (INDEPENDENT_AMBULATORY_CARE_PROVIDER_SITE_OTHER): Payer: 59 | Admitting: Obstetrics

## 2023-11-26 ENCOUNTER — Other Ambulatory Visit: Payer: 59

## 2023-11-26 VITALS — BP 126/85 | HR 87 | Wt 229.0 lb

## 2023-11-26 DIAGNOSIS — Z3A38 38 weeks gestation of pregnancy: Secondary | ICD-10-CM | POA: Diagnosis not present

## 2023-11-26 DIAGNOSIS — O10013 Pre-existing essential hypertension complicating pregnancy, third trimester: Secondary | ICD-10-CM | POA: Diagnosis not present

## 2023-11-26 DIAGNOSIS — O10913 Unspecified pre-existing hypertension complicating pregnancy, third trimester: Secondary | ICD-10-CM

## 2023-11-26 DIAGNOSIS — Z3480 Encounter for supervision of other normal pregnancy, unspecified trimester: Secondary | ICD-10-CM

## 2023-11-26 LAB — POCT URINALYSIS DIPSTICK OB
Bilirubin, UA: NEGATIVE
Blood, UA: POSITIVE
Glucose, UA: NEGATIVE
Leukocytes, UA: NEGATIVE
Nitrite, UA: NEGATIVE
Spec Grav, UA: <=1.005 — AB (ref 1.010–1.025)
Urobilinogen, UA: 0.2 U/dL
pH, UA: 7 (ref 5.0–8.0)

## 2023-11-26 NOTE — Assessment & Plan Note (Signed)
-  Normotensive today. - Continue Procardia 30 mg Daily  - IOL scheduled

## 2023-11-26 NOTE — Assessment & Plan Note (Addendum)
-   RNST today - IOL scheduled for 12/01/23 - Reviewed signs of labor and when to present, reviewed danger signs.

## 2023-11-26 NOTE — Progress Notes (Cosign Needed Addendum)
    Return Prenatal Note   Assessment/Plan   Plan  28 y.o. G2P1001 at [redacted]w[redacted]d presents for follow-up OB visit. Reviewed prenatal record including previous visit note.  Supervision of other normal pregnancy, antepartum - RNST today - IOL scheduled for 12/01/23 - Reviewed signs of labor and when to present, reviewed danger signs.    Maternal chronic hypertension, third trimester -Normotensive today. - Continue Procardia 30 mg Daily  - IOL scheduled   Orders Placed This Encounter  Procedures   POC Urinalysis Dipstick OB   Return in about 1 week (around 12/03/2023).   No future appointments.  For next visit:  Routine prenatal care    Subjective  Feeling ready for baby. Declines SVE today. Understands induction protocols and when to present to L&D.  Movement: Present Contractions: Not present  Objective   Flow sheet Vitals: Pulse Rate: 87 BP: 126/85 Fundal Height: 39 cm Total weight gain: 24 lb (10.9 kg)  General Appearance  No acute distress, well appearing, and well nourished Pulmonary   Normal work of breathing Neurologic   Alert and oriented to person, place, and time Psychiatric   Mood and affect within normal limits  Taylor Galvan, [facility] 11/26/23 11:11 AM

## 2023-12-01 ENCOUNTER — Other Ambulatory Visit: Payer: Self-pay

## 2023-12-01 ENCOUNTER — Inpatient Hospital Stay
Admission: EM | Admit: 2023-12-01 | Discharge: 2023-12-03 | DRG: 806 | Disposition: A | Discharge status: Home / Self Care | Attending: Certified Nurse Midwife | Admitting: Certified Nurse Midwife

## 2023-12-01 ENCOUNTER — Encounter: Payer: Self-pay | Admitting: Obstetrics

## 2023-12-01 DIAGNOSIS — O99324 Drug use complicating childbirth: Secondary | ICD-10-CM | POA: Diagnosis not present

## 2023-12-01 DIAGNOSIS — Z23 Encounter for immunization: Secondary | ICD-10-CM | POA: Diagnosis not present

## 2023-12-01 DIAGNOSIS — F1290 Cannabis use, unspecified, uncomplicated: Secondary | ICD-10-CM | POA: Diagnosis not present

## 2023-12-01 DIAGNOSIS — Z3A38 38 weeks gestation of pregnancy: Secondary | ICD-10-CM

## 2023-12-01 DIAGNOSIS — Z8249 Family history of ischemic heart disease and other diseases of the circulatory system: Secondary | ICD-10-CM | POA: Diagnosis not present

## 2023-12-01 DIAGNOSIS — O1092 Unspecified pre-existing hypertension complicating childbirth: Principal | ICD-10-CM | POA: Diagnosis present

## 2023-12-01 DIAGNOSIS — Z6791 Unspecified blood type, Rh negative: Secondary | ICD-10-CM

## 2023-12-01 DIAGNOSIS — O1002 Pre-existing essential hypertension complicating childbirth: Secondary | ICD-10-CM | POA: Diagnosis not present

## 2023-12-01 DIAGNOSIS — Z87891 Personal history of nicotine dependence: Secondary | ICD-10-CM

## 2023-12-01 DIAGNOSIS — Z3A39 39 weeks gestation of pregnancy: Secondary | ICD-10-CM | POA: Diagnosis not present

## 2023-12-01 DIAGNOSIS — Z3490 Encounter for supervision of normal pregnancy, unspecified, unspecified trimester: Secondary | ICD-10-CM | POA: Diagnosis present

## 2023-12-01 DIAGNOSIS — O26893 Other specified pregnancy related conditions, third trimester: Secondary | ICD-10-CM | POA: Diagnosis present

## 2023-12-01 DIAGNOSIS — O10919 Unspecified pre-existing hypertension complicating pregnancy, unspecified trimester: Secondary | ICD-10-CM | POA: Diagnosis present

## 2023-12-01 DIAGNOSIS — Z3480 Encounter for supervision of other normal pregnancy, unspecified trimester: Principal | ICD-10-CM

## 2023-12-01 DIAGNOSIS — O10913 Unspecified pre-existing hypertension complicating pregnancy, third trimester: Secondary | ICD-10-CM

## 2023-12-01 LAB — URINE DRUG SCREEN, QUALITATIVE (ARMC ONLY)
Amphetamines, Ur Screen: NOT DETECTED
Barbiturates, Ur Screen: NOT DETECTED
Benzodiazepine, Ur Scrn: NOT DETECTED
Cannabinoid 50 Ng, Ur ~~LOC~~: POSITIVE — AB
Cocaine Metabolite,Ur ~~LOC~~: NOT DETECTED
MDMA (Ecstasy)Ur Screen: NOT DETECTED
Methadone Scn, Ur: NOT DETECTED
Opiate, Ur Screen: NOT DETECTED
Phencyclidine (PCP) Ur S: NOT DETECTED
Tricyclic, Ur Screen: NOT DETECTED

## 2023-12-01 LAB — CBC
HCT: 39.9 % (ref 36.0–46.0)
Hemoglobin: 13.4 g/dL (ref 12.0–15.0)
MCH: 30.2 pg (ref 26.0–34.0)
MCHC: 33.6 g/dL (ref 30.0–36.0)
MCV: 89.9 fL (ref 80.0–100.0)
Platelets: 177 10*3/uL (ref 150–400)
RBC: 4.44 MIL/uL (ref 3.87–5.11)
RDW: 13.9 % (ref 11.5–15.5)
WBC: 9.7 10*3/uL (ref 4.0–10.5)
nRBC: 0 % (ref 0.0–0.2)

## 2023-12-01 LAB — COMPREHENSIVE METABOLIC PANEL
ALT: 10 U/L (ref 0–44)
AST: 24 U/L (ref 15–41)
Albumin: 2.7 g/dL — ABNORMAL LOW (ref 3.5–5.0)
Alkaline Phosphatase: 146 U/L — ABNORMAL HIGH (ref 38–126)
Anion gap: 10 (ref 5–15)
BUN: 11 mg/dL (ref 6–20)
CO2: 16 mmol/L — ABNORMAL LOW (ref 22–32)
Calcium: 8.6 mg/dL — ABNORMAL LOW (ref 8.9–10.3)
Chloride: 109 mmol/L (ref 98–111)
Creatinine, Ser: 0.62 mg/dL (ref 0.44–1.00)
GFR, Estimated: >60 mL/min (ref >60)
Glucose, Bld: 148 mg/dL — ABNORMAL HIGH (ref 70–99)
Potassium: 4.4 mmol/L (ref 3.5–5.1)
Sodium: 135 mmol/L (ref 135–145)
Total Bilirubin: 0.9 mg/dL (ref 0.0–1.2)
Total Protein: 6.2 g/dL — ABNORMAL LOW (ref 6.5–8.1)

## 2023-12-01 LAB — TYPE AND SCREEN
ABO/RH(D): O NEG
Antibody Screen: NEGATIVE

## 2023-12-01 LAB — PROTEIN / CREATININE RATIO, URINE
Creatinine, Urine: 123 mg/dL
Protein Creatinine Ratio: 0.17 mg/mg{creat} — ABNORMAL HIGH (ref 0.00–0.15)
Total Protein, Urine: 21 mg/dL

## 2023-12-01 MED ORDER — LACTATED RINGERS IV SOLN: 500.0000 mL | INTRAVENOUS | Status: AC | PRN

## 2023-12-01 MED ORDER — TERBUTALINE SULFATE 1 MG/ML IJ SOLN
0.2500 mg | Freq: Once | INTRAMUSCULAR | Status: AC | PRN
  Administered 2023-12-02: 0.25 mg via SUBCUTANEOUS
  Filled 2023-12-01: qty 1

## 2023-12-01 MED ORDER — FENTANYL CITRATE (PF) 100 MCG/2ML IJ SOLN
50.0000 ug | INTRAMUSCULAR | Status: DC | PRN
  Administered 2023-12-01 (×2): 100 ug via INTRAVENOUS
  Filled 2023-12-01 (×2): qty 2

## 2023-12-01 MED ORDER — ONDANSETRON HCL 4 MG/2ML IJ SOLN
4.0000 mg | Freq: Four times a day (QID) | INTRAMUSCULAR | Status: DC | PRN
  Administered 2023-12-01 – 2023-12-02 (×3): 4 mg via INTRAVENOUS
  Filled 2023-12-01 (×3): qty 2

## 2023-12-01 MED ORDER — LABETALOL HCL 5 MG/ML IV SOLN: 80.0000 mg | INTRAVENOUS | Status: DC | PRN

## 2023-12-01 MED ORDER — NIFEDIPINE ER OSMOTIC RELEASE 30 MG PO TB24
30.0000 mg | ORAL_TABLET | Freq: Every day | ORAL | Status: DC
  Administered 2023-12-02 – 2023-12-03 (×2): 30 mg via ORAL
  Filled 2023-12-01 (×2): qty 1

## 2023-12-01 MED ORDER — AMMONIA AROMATIC IN INHA
RESPIRATORY_TRACT | Status: AC
  Filled 2023-12-01: qty 10

## 2023-12-01 MED ORDER — OXYTOCIN-SODIUM CHLORIDE 30-0.9 UT/500ML-% IV SOLN
1.0000 m[IU]/min | INTRAVENOUS | Status: DC
  Administered 2023-12-01: 2 m[IU]/min via INTRAVENOUS
  Filled 2023-12-01: qty 500

## 2023-12-01 MED ORDER — LACTATED RINGERS IV SOLN
INTRAVENOUS | Status: AC
Start: 2023-12-01 — End: 2023-12-02

## 2023-12-01 MED ORDER — LIDOCAINE HCL (PF) 1 % IJ SOLN: 30.0000 mL | INTRAMUSCULAR | Status: DC | PRN

## 2023-12-01 MED ORDER — MISOPROSTOL 25 MCG QUARTER TABLET
25.0000 ug | ORAL_TABLET | Freq: Once | ORAL | Status: AC
  Administered 2023-12-01: 25 ug via ORAL
  Filled 2023-12-01: qty 1

## 2023-12-01 MED ORDER — OXYTOCIN-SODIUM CHLORIDE 30-0.9 UT/500ML-% IV SOLN: 2.5000 [IU]/h | INTRAVENOUS | Status: DC

## 2023-12-01 MED ORDER — OXYTOCIN BOLUS FROM INFUSION
333.0000 mL | Freq: Once | INTRAVENOUS | Status: AC
  Administered 2023-12-02: 333 mL via INTRAVENOUS

## 2023-12-01 MED ORDER — SOD CITRATE-CITRIC ACID 500-334 MG/5ML PO SOLN: 30.0000 mL | ORAL | Status: DC | PRN

## 2023-12-01 MED ORDER — HYDRALAZINE HCL 20 MG/ML IJ SOLN: 10.0000 mg | INTRAMUSCULAR | Status: DC | PRN

## 2023-12-01 MED ORDER — LABETALOL HCL 5 MG/ML IV SOLN: 20.0000 mg | INTRAVENOUS | Status: DC | PRN

## 2023-12-01 MED ORDER — LABETALOL HCL 5 MG/ML IV SOLN: 40.0000 mg | INTRAVENOUS | Status: DC | PRN

## 2023-12-01 MED ORDER — OXYTOCIN 10 UNIT/ML IJ SOLN
INTRAMUSCULAR | Status: AC
  Filled 2023-12-01: qty 2

## 2023-12-01 MED ORDER — MISOPROSTOL 25 MCG QUARTER TABLET
50.0000 ug | ORAL_TABLET | ORAL | Status: DC | PRN
  Administered 2023-12-01 (×2): 50 ug via VAGINAL
  Filled 2023-12-01 (×2): qty 1

## 2023-12-01 MED ORDER — LIDOCAINE HCL (PF) 1 % IJ SOLN
INTRAMUSCULAR | Status: AC
  Filled 2023-12-01: qty 30

## 2023-12-01 MED ORDER — MISOPROSTOL 50MCG HALF TABLET
50.0000 ug | ORAL_TABLET | Freq: Once | ORAL | Status: AC
  Administered 2023-12-01: 50 ug via VAGINAL
  Filled 2023-12-01: qty 1

## 2023-12-01 MED ORDER — MISOPROSTOL 200 MCG PO TABS
ORAL_TABLET | ORAL | Status: AC
  Filled 2023-12-01: qty 4

## 2023-12-01 MED ORDER — OXYTOCIN 10 UNIT/ML IJ SOLN
INTRAMUSCULAR | Status: AC
  Filled 2023-12-01: qty 1

## 2023-12-01 MED ORDER — SODIUM CHLORIDE 0.9 % IV SOLN
12.5000 mg | Freq: Once | INTRAVENOUS | Status: AC
  Administered 2023-12-01: 12.5 mg via INTRAVENOUS
  Filled 2023-12-01: qty 12.5

## 2023-12-01 MED ORDER — HYDROXYZINE HCL 25 MG PO TABS: 50.0000 mg | ORAL_TABLET | Freq: Four times a day (QID) | ORAL | Status: DC | PRN

## 2023-12-01 NOTE — Progress Notes (Signed)
 Patient ID: Taylor Galvan, female   DOB: Jun 23, 1996, 28 y.o.   MRN: [id number]   Cook's balloon out around 2100, VE FT/50/-3.  Offered and recommend  placement of another cook's balloon. Pt hesitant at this time. Will reassess around 2300.   Carie Caddy, CNM  Domingo Pulse, [facility]  12/01/2023 9:42 PM

## 2023-12-01 NOTE — H&P (Signed)
 Taylor Galvan is a 28 y.o. female, G2P1001 at [redacted]w[redacted]d by 11 wk ultrasound who is presenting for induction of labor due to chronic hypertension, for which she takes Procardia 30 mg daily. Her partner Taylor Galvan is with her today for support. Taylor Galvan is excited to meet Taylor Galvan! She endorses good fetal movement, denies VB and LOF. She denies headache, SOB, visual changes, and RUQ pain. Taylor Galvan is planning on having an epidural for labor coping. She mentions a history of having an episiotomy and VAVD in her previous birth.   OB History     Gravida  2   Para  1   Term  1   Preterm  0   AB  0   Living  1      SAB  0   IAB  0   Ectopic  0   Multiple  0   Live Births  1          Past Medical History:  Diagnosis Date   Anxiety    Depression    Elevated blood pressure reading without diagnosis of hypertension 08/12/2020   Kidney stone 04/18/2018   Kidney stone complicating pregnancy, third trimester 04/18/2018   Kidney stones    Past Surgical History:  Procedure Laterality Date   NO PAST SURGERIES     Family History: family history includes Anxiety disorder in her maternal grandfather, maternal grandmother, mother, and paternal grandmother; COPD in her maternal grandfather; Healthy in her father and half-brother; Heart failure in her maternal grandfather; Hypertension in her maternal grandfather, maternal grandmother, and mother; Kidney Stones in her maternal grandfather and mother. Social History:  reports that she has quit smoking. Her smoking use included cigarettes. She has never used smokeless tobacco. She reports that she does not currently use drugs after having used the following drugs: Marijuana. She reports that she does not drink alcohol.     Maternal Diabetes: No Genetic Screening: Normal Maternal Ultrasounds/Referrals: Normal Fetal Ultrasounds or other Referrals:  Other: monthly BPPs  Maternal Substance Abuse:  initial UDS positive for cannabinoids Significant  Maternal Medications:  Meds include: Other: procardia 30mg  daily Significant Maternal Lab Results:  Rh negative Number of Prenatal Visits:greater than 3 verified prenatal visits Maternal Vaccinations:TDap Other Comments:  None  Review of Systems  All other systems reviewed and are negative.  History   Blood pressure 124/78, pulse 84, temperature 97.8 F (36.6 C), temperature source Oral, resp. rate 18, height 5\' 9"  (1.753 m), weight 103.9 kg, last menstrual period 02/27/2023. Maternal Exam:  Uterine Assessment: Contraction strength is mild.  Abdomen: Patient reports no abdominal tenderness. Estimated fetal weight is 7 lbs by Leopolds.   Fetal presentation: vertex Introitus: Normal vulva. Cervix: Cervix evaluated by digital exam.     Physical Exam Vitals reviewed.  Constitutional:      Appearance: Normal appearance.  HENT:     Head: Normocephalic.  Cardiovascular:     Rate and Rhythm: Normal rate.     Pulses: Normal pulses.     Heart sounds: Normal heart sounds.  Pulmonary:     Effort: Pulmonary effort is normal.     Breath sounds: Normal breath sounds.  Abdominal:     Palpations: Abdomen is soft.     Comments: Gravid   Genitourinary:    General: Normal vulva.  Musculoskeletal:        General: Normal range of motion.  Skin:    General: Skin is warm and dry.  Neurological:     Mental  Status: She is alert.  Psychiatric:        Mood and Affect: Mood normal.        Behavior: Behavior normal.     Fetus A Non-Stress Test Interpretation for 12/01/23  Indication:  Admission to Labor & Delivery for IOL  Fetal Heart Rate A Mode: External Baseline Rate (A): 130 bpm (fht) Variability: Moderate Accelerations: 15 x 15 Decelerations: Variable (variable decel x1 with spontaneous return to baseline)  Uterine Activity Mode: Toco Contraction Frequency (min): none Contraction Duration (sec): 40-60 Contraction Quality: Mild Resting Tone Palpated: Relaxed Resting Time:  Adequate     Prenatal labs: ABO, Rh: --/--/PENDING (03/01 1308) Antibody: PENDING (03/01 6578) Rubella: 3.93 (08/26 1557) RPR: Non Reactive (12/19 1102)  HBsAg: Negative (08/26 1557)  HIV: Non Reactive (12/19 1102)  GBS: Negative/-- (02/10 1344)   Assessment/Plan: -Admit to L&D for IOL -Continuous fetal monitoring -Continue Procardia.  -Cervical exam: difficulty reaching cervical os due to small introitus. Able to appreciate that cervix is posterior and fetal station is ballottable.  -Fetal monitoring initially Category II due to one decel after a 3 min long contraction around 0819. Now Category I.  -Oral and vaginal misoprostol placed.  -Will re-check cervix in about 4 hrs and discuss additional ripening vs pitocin.  -MD Roby aware of pt's arrival to L&D.   Cindra Eves, SNM 12/01/2023 1012 AM  Adalynn Corne M Raul Winterhalter 12/01/2023, 10:12 AM

## 2023-12-01 NOTE — Progress Notes (Addendum)
 Taylor Galvan is a 28 y.o. G2P1001 at [redacted]w[redacted]d by ultrasound admitted for induction of labor due to chronic hypertension.  Subjective: Taylor Galvan is resting comfortably in bed, with her partner at bedside. She is not feeling her contractions.   Objective: BP 111/67 (BP Location: Left Arm)   Pulse 88   Temp 97.9 F (36.6 C) (Oral)   Resp 16   Ht 5\' 9"  (1.753 m)   Wt 103.9 kg   LMP 02/27/2023 (Approximate)   BMI 33.82 kg/m  No intake/output data recorded. No intake/output data recorded.  FHT:  FHR: 130 bpm, variability: moderate,  accelerations:  Present,  decelerations:  Absent UC:   irregular, every 5-7 minutes, irritability SVE:   Dilation: Fingertip Effacement (%): Thick Station: Ballotable Exam by:: [employer] SNM  Labs: Lab Results  Component Value Date   WBC 9.7 12/01/2023   HGB 13.4 12/01/2023   HCT 39.9 12/01/2023   MCV 89.9 12/01/2023   PLT 177 12/01/2023    Assessment / Plan: -Vaginal misoprostol placed. Cervix mid-position this check.  -Will recheck in 4 hours and evaluate for pitocin/arom and/or Cook.  -Continuous monitoring for cHTN.  Labor: progressing after miso placement.  Preeclampsia:   n/a Fetal Wellbeing:  Category I Pain Control:  Labor support without medications, anticipate epidural  I/D:  n/a Anticipated MOD:  NSVD  [name], Student-MidWife 12/01/2023, 2:21 PM Carie Caddy, CNM present for all portions of care.

## 2023-12-01 NOTE — Progress Notes (Signed)
 Taylor Galvan is a 28 y.o. G2P1001 at [redacted]w[redacted]d by ultrasound admitted for induction of labor due to [facility].  Subjective: Resting comfortably. Family at bedside.   Objective: BP 136/69   Pulse 72   Temp (!) 97.5 F (36.4 C) (Axillary)   Resp 16   Ht 5\' 9"  (1.753 m)   Wt 103.9 kg   LMP 02/27/2023 (Approximate)   BMI 33.82 kg/m  I/O last 3 completed shifts: In: -  Out: 300 [Emesis/NG output:300] Total I/O In: 375 [I.V.:325; IV Piggyback:50] Out: -   FHT:  FHR: 135 bpm, variability: moderate,  accelerations:  Present,  decelerations:  Absent UC:   irregular, every 3-5 minutes SVE:   Dilation: 4 Effacement (%): 50 Station: -3 Exam by:: L Zorion Nims CNM  Labs: Lab Results  Component Value Date   WBC 9.7 12/01/2023   HGB 13.4 12/01/2023   HCT 39.9 12/01/2023   MCV 89.9 12/01/2023   PLT 177 12/01/2023    Assessment / Plan: IOL d/t CHTN in early labor  Labor: attempted to place cook's balloon, found to be 4cm. Will start Pitocin.  CHTN  labs stable Fetal Wellbeing:  Category I Pain Control:  IV pain meds, planning epidural  I/D:   GSB negative, Membranes intact  Anticipated MOD:  NSVD  Ellouise Newer Verne Lanuza, CNM 12/01/2023, 11:17 PM

## 2023-12-01 NOTE — Progress Notes (Signed)
 Taylor Galvan is a 28 y.o. G2P1001 at [redacted]w[redacted]d by ultrasound admitted for induction of labor due to Chronic Hypertension.  Subjective: Taylor Galvan is resting in bed with partner, mother, and partner's mother at bedside. She reports feeling cramping a little more. She reports feeling nervous about having a Cook catheter placed and is afraid it will hurt too much.   Objective: BP 111/68 (BP Location: Left Arm)   Pulse 86   Temp 98.3 F (36.8 C) (Oral)   Resp 16   Ht 5\' 9"  (1.753 m)   Wt 103.9 kg   LMP 02/27/2023 (Approximate)   BMI 33.82 kg/m  I/O last 3 completed shifts: In: -  Out: 300 [Emesis/NG output:300] Total I/O In: 375 [I.V.:325; IV Piggyback:50] Out: -   FHT:  FHR: 135 bpm, variability: moderate,  accelerations:  Present,  decelerations:  Absent UC:   irregular, every 2-5 minutes, irritability SVE:   Dilation: Fingertip Effacement (%): Thick Station: Ballotable Exam by:: [employer] SNM  Labs: Lab Results  Component Value Date   WBC 9.7 12/01/2023   HGB 13.4 12/01/2023   HCT 39.9 12/01/2023   MCV 89.9 12/01/2023   PLT 177 12/01/2023    Assessment / Plan: -Discussed placing a third dose of misoprostol vs placing a Cook catheter and misoprostol with IV pain medication. After discussing these options with her mother, Taylor Galvan opted for [facility] catheter and misoprostol placement with pain medication.  -Cook catheter placed at 1930, followed by third dose of vaginal misoprostol.  -Will consider pitocin in 4 hrs after misoprostol placement.  Labor:  ripening stage  Preeclampsia:   n/a Fetal Wellbeing:  Category I Pain Control:  IV pain meds, anticipate epidural in the future I/D:  n/a Anticipated MOD:  NSVD  [name], Student-MidWife 12/01/2023, 7:37 PM Carie Caddy, CNM present for all portions of care.

## 2023-12-01 NOTE — Progress Notes (Signed)
 Patient arrived in LDR 5 for scheduled induction of labor. Reports good fetal movement. Denies contractions, leaking of fluid, or vaginal bleeding. Patient oriented to room and plan of care. Verbalized understanding.

## 2023-12-02 ENCOUNTER — Encounter: Payer: Self-pay | Admitting: Obstetrics

## 2023-12-02 ENCOUNTER — Inpatient Hospital Stay: Admitting: Anesthesiology

## 2023-12-02 DIAGNOSIS — Z3A39 39 weeks gestation of pregnancy: Secondary | ICD-10-CM

## 2023-12-02 DIAGNOSIS — O1002 Pre-existing essential hypertension complicating childbirth: Secondary | ICD-10-CM

## 2023-12-02 LAB — RPR: RPR Ser Ql: NONREACTIVE

## 2023-12-02 MED ORDER — ONDANSETRON HCL 4 MG PO TABS: 4.0000 mg | ORAL_TABLET | ORAL | Status: DC | PRN

## 2023-12-02 MED ORDER — DIPHENHYDRAMINE HCL 25 MG PO CAPS
25.0000 mg | ORAL_CAPSULE | Freq: Four times a day (QID) | ORAL | Status: DC | PRN
  Administered 2023-12-02: 25 mg via ORAL
  Filled 2023-12-02: qty 1

## 2023-12-02 MED ORDER — PHENYLEPHRINE 80 MCG/ML (10ML) SYRINGE FOR IV PUSH (FOR BLOOD PRESSURE SUPPORT): 80.0000 ug | PREFILLED_SYRINGE | INTRAVENOUS | Status: DC | PRN

## 2023-12-02 MED ORDER — FENTANYL-BUPIVACAINE-NACL 0.5-0.125-0.9 MG/250ML-% EP SOLN
EPIDURAL | Status: AC
  Filled 2023-12-02: qty 250

## 2023-12-02 MED ORDER — DIPHENHYDRAMINE HCL 50 MG/ML IJ SOLN
12.5000 mg | INTRAMUSCULAR | Status: DC | PRN
  Filled 2023-12-02: qty 1

## 2023-12-02 MED ORDER — LIDOCAINE HCL (PF) 1 % IJ SOLN
INTRAMUSCULAR | Status: DC | PRN
  Administered 2023-12-02: 1 mL

## 2023-12-02 MED ORDER — FENTANYL-BUPIVACAINE-NACL 0.5-0.125-0.9 MG/250ML-% EP SOLN
12.0000 mL/h | EPIDURAL | Status: DC | PRN
  Administered 2023-12-02: 12 mL/h via EPIDURAL

## 2023-12-02 MED ORDER — IBUPROFEN 600 MG PO TABS
600.0000 mg | ORAL_TABLET | Freq: Four times a day (QID) | ORAL | Status: DC
  Administered 2023-12-02 – 2023-12-03 (×4): 600 mg via ORAL
  Filled 2023-12-02 (×4): qty 1

## 2023-12-02 MED ORDER — ASPIRIN 81 MG PO CHEW
81.0000 mg | CHEWABLE_TABLET | Freq: Every day | ORAL | Status: DC
  Administered 2023-12-03: 81 mg via ORAL
  Filled 2023-12-02: qty 1

## 2023-12-02 MED ORDER — DOCUSATE SODIUM 100 MG PO CAPS
100.0000 mg | ORAL_CAPSULE | Freq: Two times a day (BID) | ORAL | Status: DC
  Administered 2023-12-03: 100 mg via ORAL
  Filled 2023-12-02: qty 1

## 2023-12-02 MED ORDER — DIPHENHYDRAMINE HCL 25 MG PO CAPS: 25.0000 mg | ORAL_CAPSULE | Freq: Four times a day (QID) | ORAL | Status: DC | PRN

## 2023-12-02 MED ORDER — FERROUS SULFATE 325 (65 FE) MG PO TABS
325.0000 mg | ORAL_TABLET | Freq: Every day | ORAL | Status: DC
  Filled 2023-12-02: qty 1

## 2023-12-02 MED ORDER — ZOLPIDEM TARTRATE 5 MG PO TABS: 5.0000 mg | ORAL_TABLET | Freq: Every evening | ORAL | Status: DC | PRN

## 2023-12-02 MED ORDER — BENZOCAINE-MENTHOL 20-0.5 % EX AERO
1.0000 | INHALATION_SPRAY | CUTANEOUS | Status: DC | PRN
  Administered 2023-12-02: 1 via TOPICAL
  Filled 2023-12-02: qty 56

## 2023-12-02 MED ORDER — LACTATED RINGERS IV SOLN
500.0000 mL | Freq: Once | INTRAVENOUS | Status: AC
  Administered 2023-12-02: 500 mL via INTRAVENOUS

## 2023-12-02 MED ORDER — PRENATAL 27-0.8 MG PO TABS: 1.0000 | ORAL_TABLET | Freq: Every day | ORAL | Status: DC

## 2023-12-02 MED ORDER — DIBUCAINE (PERIANAL) 1 % EX OINT: 1.0000 | TOPICAL_OINTMENT | CUTANEOUS | Status: DC | PRN

## 2023-12-02 MED ORDER — LACTATED RINGERS IV SOLN: INTRAVENOUS | Status: DC

## 2023-12-02 MED ORDER — PRENATAL MULTIVITAMIN CH
1.0000 | ORAL_TABLET | Freq: Every day | ORAL | Status: DC
  Administered 2023-12-03: 1 via ORAL
  Filled 2023-12-02: qty 1

## 2023-12-02 MED ORDER — ACETAMINOPHEN 500 MG PO TABS: 1000.0000 mg | ORAL_TABLET | Freq: Four times a day (QID) | ORAL | Status: DC | PRN

## 2023-12-02 MED ORDER — SIMETHICONE 80 MG PO CHEW: 80.0000 mg | CHEWABLE_TABLET | ORAL | Status: DC | PRN

## 2023-12-02 MED ORDER — WITCH HAZEL-GLYCERIN EX PADS
1.0000 | MEDICATED_PAD | CUTANEOUS | Status: DC | PRN
  Administered 2023-12-02: 1 via TOPICAL
  Filled 2023-12-02: qty 100

## 2023-12-02 MED ORDER — COCONUT OIL OIL: 1.0000 | TOPICAL_OIL | Status: DC | PRN

## 2023-12-02 MED ORDER — LIDOCAINE-EPINEPHRINE (PF) 1.5 %-1:200000 IJ SOLN
INTRAMUSCULAR | Status: DC | PRN
  Administered 2023-12-02: 3 mL via EPIDURAL

## 2023-12-02 MED ORDER — ONDANSETRON HCL 4 MG/2ML IJ SOLN: 4.0000 mg | INTRAMUSCULAR | Status: DC | PRN

## 2023-12-02 MED ORDER — SERTRALINE HCL 100 MG PO TABS
100.0000 mg | ORAL_TABLET | Freq: Every day | ORAL | Status: DC
  Administered 2023-12-02 – 2023-12-03 (×2): 100 mg via ORAL
  Filled 2023-12-02 (×2): qty 1

## 2023-12-02 MED ORDER — EPHEDRINE 5 MG/ML INJ: 10.0000 mg | INTRAVENOUS | Status: DC | PRN

## 2023-12-02 NOTE — Discharge Summary (Signed)
 Postpartum Discharge Summary  Date of Service updated***     Patient Name: Taylor Galvan DOB: 1995-12-05 MRN: [id number]  Date of admission: 12/01/2023 Delivery date:12/02/2023 Delivering provider: Dominica Severin Date of discharge: 12/02/2023  Admitting diagnosis: Encounter for induction of labor [Z34.90] Intrauterine pregnancy: [redacted]w[redacted]d     Secondary diagnosis:  Principal Problem:   Encounter for induction of labor Active Problems:   Chronic pre-existing hypertension during pregnancy   Normal spontaneous vaginal delivery   Routine postpartum follow-up  Additional problems: ***    Discharge diagnosis: Term Pregnancy Delivered and CHTN                                              Post partum procedures:{Postpartum procedures:23558} Augmentation: Pitocin and Cytotec Complications: None  Hospital course: Induction of Labor With Vaginal Delivery   28 y.o. yo G2P1001 at [redacted]w[redacted]d was admitted to the hospital 12/01/2023 for induction of labor.  Indication for induction:  cHTN .  Patient had an labor course complicated by FHR decelerations Membrane Rupture Time/Date: 3:00 AM,12/02/2023  Delivery Method:Vaginal, Spontaneous Operative Delivery:N/A Episiotomy: None Lacerations:  None Details of delivery can be found in separate delivery note.  Patient had a postpartum course complicated by***. Patient is discharged home 12/02/23.  Newborn Data: Birth date:12/02/2023 Birth time:1:10 PM Gender:Female Living status:Living Apgars:7 ,9  Weight:   Magnesium Sulfate received: No BMZ received: No Rhophylac:{Rhophylac received:30440032} MMR:N/A T-DaP:Given prenatally Flu: {BJY:78295} RSV Vaccine received: {RSV:31013} Transfusion:{Transfusion received:30440034} Immunizations administered: Immunization History  Administered Date(s) Administered   Tdap 04/29/2018, 09/17/2023    Physical exam  Vitals:   12/02/23 1110 12/02/23 1201 12/02/23 1330 12/02/23 1336  BP:  121/60  134/80   Pulse:  75  90  Resp:  15  18  Temp: 98.9 F (37.2 C)  98.2 F (36.8 C)   TempSrc: Oral  Oral   SpO2:      Weight:      Height:       General: {Exam; general:21111117} Lochia: {Desc; appropriate/inappropriate:30686::"appropriate"} Uterine Fundus: {Desc; firm/soft:30687} Incision: {Exam; incision:21111123} DVT Evaluation: {Exam; dvt:2111122} Labs: Lab Results  Component Value Date   WBC 9.7 12/01/2023   HGB 13.4 12/01/2023   HCT 39.9 12/01/2023   MCV 89.9 12/01/2023   PLT 177 12/01/2023      Latest Ref Rng & Units 12/01/2023    8:21 AM  CMP  Glucose 70 - 99 mg/dL 621   BUN 6 - 20 mg/dL 11   Creatinine 3.08 - 1.00 mg/dL 6.57   Sodium 846 - 962 mmol/L 135   Potassium 3.5 - 5.1 mmol/L 4.4   Chloride 98 - 111 mmol/L 109   CO2 22 - 32 mmol/L 16   Calcium 8.9 - 10.3 mg/dL 8.6   Total Protein 6.5 - 8.1 g/dL 6.2   Total Bilirubin 0.0 - 1.2 mg/dL 0.9   Alkaline Phos 38 - 126 U/L 146   AST 15 - 41 U/L 24   ALT 0 - 44 U/L 10    Edinburgh Score:    05/24/2023    9:00 AM  Edinburgh Postnatal Depression Scale Screening Tool  I have been able to laugh and see the funny side of things. 0  I have looked forward with enjoyment to things. 0  I have blamed myself unnecessarily when things went wrong. 0  I have been anxious or  worried for no good reason. 0  I have felt scared or panicky for no good reason. 0  Things have been getting on top of me. 0  I have been so unhappy that I have had difficulty sleeping. 0  I have felt sad or miserable. 0  I have been so unhappy that I have been crying. 0  The thought of harming myself has occurred to me. 0  Edinburgh Postnatal Depression Scale Total 0      After visit meds:  Allergies as of 12/02/2023       Reactions   Wellbutrin [bupropion] Other (See Comments)   Suicidal Ideation, Panic Attacks     Med Rec must be completed prior to using this [facility]***        Discharge home in stable condition Infant Feeding: {Baby  feeding:23562} Infant Disposition:{CHL IP OB HOME WITH ONGEXB:28413} Discharge instruction: per After Visit Summary and Postpartum booklet. Activity: Advance as tolerated. Pelvic rest for 6 weeks.  Diet: routine diet Anticipated Birth Control: {Birth Control:23956} Postpartum Appointment:{Outpatient follow up:23559} Additional Postpartum F/U: {PP Procedure:23957} Future Appointments:No future appointments. Follow up Visit:      12/02/2023 Dominica Severin, CNM

## 2023-12-02 NOTE — Progress Notes (Addendum)
 Labor Progress Note   ASSESSMENT/PLAN   Taylor Galvan 28 y.o.   G2P1001  at [redacted]w[redacted]d here for IOL due to [facility].  FWB:  - Fetal well being assessed:    Cat II    GBS: - GBS negative  LABOR: - Now in active labor, doing well. - Pain Management: epidural  - Discussed options with patient and will continue pitocin titration - Anticipate SVD   Principal Problem:   Encounter for induction of labor   SUBJECTIVE/OBJECTIVE   SUBJECTIVE: comfortable with epidural    OBJECTIVE: Vital Signs: Patient Vitals for the past 12 hrs:  BP Temp Temp src Pulse Resp SpO2  12/02/23 0945 118/65 -- -- 79 -- 97 %  12/02/23 0900 -- (!) 97.5 F (36.4 C) Axillary -- -- --  12/02/23 0801 (!) 103/55 -- -- 77 -- --  12/02/23 0731 (!) 104/56 -- -- 77 -- 97 %  12/02/23 0703 -- 98.3 F (36.8 C) Oral -- 16 --  12/02/23 0700 (!) 101/53 -- -- 84 -- 94 %  12/02/23 0630 (!) 108/45 -- -- 75 -- 97 %  12/02/23 0624 -- 98.2 F (36.8 C) Oral -- -- 99 %  12/02/23 0611 (!) 104/52 -- -- 85 -- --  12/02/23 0600 (!) 104/49 -- -- 89 -- --  12/02/23 0500 -- 98.5 F (36.9 C) Axillary -- -- --  12/02/23 0430 128/68 -- -- 71 -- 95 %  12/02/23 0400 125/65 -- -- 69 -- 98 %  12/02/23 0340 133/79 -- -- 78 -- --  12/01/23 2325 139/82 98.2 F (36.8 C) Oral 71 -- --    Last SVE:  Dilation: 8 Effacement (%): 80 Cervical Position: Middle Station: 0 Presentation: Vertex Exam by:: Robb Matar CNM -  , Rupture Date: 12/02/23, Rupture Time: 0300,    FHR:   - Mode: External  - Baseline Rate (A): 130 bpm (fht)  -    - Characteristics (ie - accels, decels): Accelerations: 15 x 15, variable decelerations  -    UTERINE ACTIVITY:   - Mode: Intrauterine pressure catheter  - Contraction Frequency (min): 3-5 minutes

## 2023-12-02 NOTE — Significant Event (Cosign Needed Addendum)
 At around 0430, recurrent variables noted on FHR with contractions while placing urinary catheter. Cervical exam 5/70/-2. IUPC placed for amnioinfusion. Prolonged decel occurred shortly after, which was not resolved with multiple position changes. IVF bolus given, pitocin paused. FSE placed, and Lynita assisted to hands and knees by RN team. Terbutaline given. FHR back to baseline shortly after, and MD Roby aware and now in unit. Will follow.   Cindra Eves, SNM Carie Caddy, CNM present for all portions of care.

## 2023-12-02 NOTE — Progress Notes (Signed)
 Labor Progress Note   ASSESSMENT/PLAN   Taylor Galvan 28 y.o.   G2P1001  at [redacted]w[redacted]d here for IOL due to cHTN on nifedipine.  FWB:  - Fetal well being assessed: Category II GBS: - GBS negative  LABOR: - Now in early active labor, will discontinue pitocin given repetitive variables despite amnioinfusion & position changes. Restart in 11m. - Pain Management: epidural in place - Discussed options with patient and in agreement with current plan. Family at bedside - Anticipate SVD   Principal Problem:   Encounter for induction of labor   SUBJECTIVE/OBJECTIVE   SUBJECTIVE: Comfortable with epidural.    OBJECTIVE: Vital Signs: Patient Vitals for the past 12 hrs:  BP Temp Temp src Pulse Resp SpO2  12/02/23 0731 (!) 104/56 -- -- 77 -- 97 %  12/02/23 0703 -- 98.3 F (36.8 C) Oral -- 16 --  12/02/23 0700 (!) 101/53 -- -- 84 -- 94 %  12/02/23 0630 (!) 108/45 -- -- 75 -- 97 %  12/02/23 0624 -- 98.2 F (36.8 C) Oral -- -- 99 %  12/02/23 0611 (!) 104/52 -- -- 85 -- --  12/02/23 0600 (!) 104/49 -- -- 89 -- --  12/02/23 0500 -- 98.5 F (36.9 C) Axillary -- -- --  12/02/23 0430 128/68 -- -- 71 -- 95 %  12/02/23 0400 125/65 -- -- 69 -- 98 %  12/02/23 0340 133/79 -- -- 78 -- --  12/01/23 2325 139/82 98.2 F (36.8 C) Oral 71 -- --    Last SVE:  Dilation: 6 Effacement (%): 70 Cervical Position: Middle Station: -2 Presentation: Vertex Exam by:: R Wolford SCNM -  , Rupture Date: 12/02/23, Rupture Time: 0300,    FHR:   - Mode: External  - Baseline Rate (A): 135 bpm (fht)  -    - Characteristics (ie - accels, decels): Accelerations: 15 x 15  -    UTERINE ACTIVITY:   - Mode: Intrauterine pressure catheter  - Contraction Frequency (min): 2.5-4 minutes

## 2023-12-02 NOTE — Anesthesia Procedure Notes (Signed)
 Epidural Patient location during procedure: OB Start time: 12/02/2023 3:30 AM End time: 12/02/2023 3:42 AM  Staffing Anesthesiologist: Foye Deer, MD Performed: anesthesiologist   Preanesthetic Checklist Completed: patient identified, IV checked, site marked, risks and benefits discussed, surgical consent, monitors and equipment checked, pre-op evaluation and timeout performed  Epidural Patient position: sitting Prep: ChloraPrep Patient monitoring: heart rate, continuous pulse ox and blood pressure Approach: midline Location: L3-L4 Injection technique: LOR saline  Needle:  Needle type: Tuohy  Needle gauge: 18 G Needle length: 9 cm Needle insertion depth: 7 cm Catheter type: closed end Catheter size: 20 Guage Catheter at skin depth: 12 cm Test dose: negative and 1.5% lidocaine with Epi 1:200 K  Assessment Events: blood not aspirated, no cerebrospinal fluid, injection not painful, no injection resistance and no paresthesia  Additional Notes Reason for block:procedure for pain

## 2023-12-02 NOTE — Anesthesia Preprocedure Evaluation (Signed)
 Anesthesia Evaluation  Patient identified by MRN, date of birth, ID band Patient awake    Reviewed: Allergy & Precautions, H&P , NPO status , Patient's Chart, lab work & pertinent test results  Airway Mallampati: II  TM Distance: >3 FB Neck ROM: full    Dental no notable dental hx.    Pulmonary neg pulmonary ROS, former smoker   Pulmonary exam normal        Cardiovascular Exercise Tolerance: Good hypertension, Normal cardiovascular exam     Neuro/Psych    GI/Hepatic negative GI ROS,,,  Endo/Other    Renal/GU   negative genitourinary   Musculoskeletal   Abdominal  (+) + obese  Peds  Hematology negative hematology ROS (+)   Anesthesia Other Findings Past Medical History: No date: Anxiety No date: Depression 08/12/2020: Elevated blood pressure reading without diagnosis of  hypertension 04/18/2018: Kidney stone 04/18/2018: Kidney stone complicating pregnancy, third trimester No date: Kidney stones  Past Surgical History: No date: NO PAST SURGERIES  BMI    Body Mass Index: 33.82 kg/m      Reproductive/Obstetrics (+) Pregnancy                              Anesthesia Physical Anesthesia Plan  ASA: 2  Anesthesia Plan: Epidural   Post-op Pain Management:    Induction:   PONV Risk Score and Plan:   Airway Management Planned:   Additional Equipment:   Intra-op Plan:   Post-operative Plan:   Informed Consent: I have reviewed the patients History and Physical, chart, labs and discussed the procedure including the risks, benefits and alternatives for the proposed anesthesia with the patient or authorized representative who has indicated his/her understanding and acceptance.       Plan Discussed with: Anesthesiologist and CRNA  Anesthesia Plan Comments:          Anesthesia Quick Evaluation

## 2023-12-02 NOTE — Progress Notes (Signed)
 Patient ID: Taylor Galvan, female   DOB: May 14, 1996, 28 y.o.   MRN: [id number]  Pt with prolonged decel now back to baseline. VE 6cm, IUPC placed and amnioinfusion started, terb given. Dr Lonny Prude called to assess. Dr Lonny Prude enroute.  Carie Caddy, CNM  [facility]  12/02/2023 4:55 AM

## 2023-12-03 ENCOUNTER — Other Ambulatory Visit: Payer: Self-pay

## 2023-12-03 ENCOUNTER — Other Ambulatory Visit: Payer: 59

## 2023-12-03 ENCOUNTER — Encounter: Payer: 59 | Admitting: Licensed Practical Nurse

## 2023-12-03 LAB — CBC
HCT: 34.7 % — ABNORMAL LOW (ref 36.0–46.0)
Hemoglobin: 11.9 g/dL — ABNORMAL LOW (ref 12.0–15.0)
MCH: 30.3 pg (ref 26.0–34.0)
MCHC: 34.3 g/dL (ref 30.0–36.0)
MCV: 88.3 fL (ref 80.0–100.0)
Platelets: 162 10*3/uL (ref 150–400)
RBC: 3.93 MIL/uL (ref 3.87–5.11)
RDW: 13.7 % (ref 11.5–15.5)
WBC: 10.4 10*3/uL (ref 4.0–10.5)
nRBC: 0 % (ref 0.0–0.2)

## 2023-12-03 MED ORDER — ACETAMINOPHEN 500 MG PO TABS: 1000.0000 mg | ORAL_TABLET | Freq: Four times a day (QID) | ORAL | Status: AC | PRN

## 2023-12-03 MED ORDER — FERROUS SULFATE 325 (65 FE) MG PO TABS: 325.0000 mg | ORAL_TABLET | Freq: Every day | ORAL | 3 refills | Status: AC

## 2023-12-03 MED ORDER — IBUPROFEN 600 MG PO TABS: 600.0000 mg | ORAL_TABLET | Freq: Four times a day (QID) | ORAL | 0 refills | Status: AC

## 2023-12-03 NOTE — Anesthesia Postprocedure Evaluation (Signed)
 Anesthesia Post Note  Patient: Taylor Galvan  Procedure(s) Performed: AN AD HOC LABOR EPIDURAL  Patient location during evaluation: Mother Baby Anesthesia Type: Epidural Level of consciousness: awake and alert and oriented Pain management: pain level controlled Vital Signs Assessment: post-procedure vital signs reviewed and stable Respiratory status: respiratory function stable Cardiovascular status: stable Postop Assessment: no headache, no backache, patient able to bend at knees, no apparent nausea or vomiting, able to ambulate and adequate PO intake Anesthetic complications: no   No notable events documented.   Last Vitals:  Vitals:   12/02/23 2345 12/03/23 0338  BP: 137/82 108/66  Pulse: 79 76  Resp: 20 18  Temp:  36.6 C  SpO2: 99% 100%    Last Pain:  Vitals:   12/03/23 0634  TempSrc:   PainSc: 0-No pain                 Zachary George

## 2023-12-03 NOTE — Progress Notes (Signed)
 Patient discharged home with family. Discharge instructions, when to follow up, and prescriptions reviewed with patient. Patient verbalized understanding. Patient will be escorted out by auxiliary.

## 2023-12-03 NOTE — TOC Progression Note (Signed)
 Transition of Care [facility]) - Progression Note    Patient Details  Name: Taylor Galvan MRN: [id number] Date of Birth: 1996/04/08  [facility] CM/SW Contact  Garret Reddish, RN Phone Number: 12/03/2023, 12:10 PM  Clinical Narrative:    Received call from [place] from CPS.  Report given.  Report has been screened out.  Will follow cord blood for baby.   I have informed staff nurse of the above information.         Expected Discharge Plan and Services                                               Social Determinants of Health (SDOH) Interventions SDOH Screenings   Food Insecurity: No Food Insecurity (12/01/2023)  Housing: Low Risk  (12/01/2023)  Transportation Needs: No Transportation Needs (12/01/2023)  Utilities: Not At Risk (12/01/2023)  Depression (PHQ2-9): Low Risk  (08/08/2022)  Financial Resource Strain: Low Risk  (05/24/2023)  Physical Activity: Inactive (05/24/2023)  Social Connections: Unknown (12/01/2023)  Stress: No Stress Concern Present (05/24/2023)  Tobacco Use: Medium Risk (12/01/2023)  Health Literacy: Adequate Health Literacy (05/24/2023)    Readmission Risk Interventions     No data to display

## 2023-12-03 NOTE — Lactation Note (Addendum)
 This note was copied from a baby's chart. Lactation Consultation Note  Patient Name: Taylor Galvan [id number] Date: 12/03/2023 Age:30 hours Reason for consult: Initial assessment   Maternal Data  Initial Assessment with 24hr old baby Taylor and P2 pt. This was a SVD. Pt with history of anxiety, depression and chronic hypertension.   Baby seen at 24hrs of life. Dyad has been cleared for discharge. Mom stated that she attempted to breastfeed her first baby, but stopped after 1 month. Mom stated that 1st baby had lengthy feeds that became mentally too much for her to continue. She felt she was always attached to baby or pump. Mom and Dad expressed having those same concerns currently. Baby has been feeding for 30-40 mins on each breast. Dad inquired about how to tell if baby was eating or using mom as a pacifier. Mom has been doing mainly crossed cradle position, which feels comfortable. Mom stated that outside of feeding lengths, baby has been breastfeeding well with plenty of wet and stool diapers. No other questions or concerns.  Has patient been taught Hand Expression?: Yes Does the patient have breastfeeding experience prior to this delivery?: Yes How long did the patient breastfeed?: 1 month  Feeding Mother's Current Feeding Choice: Breast Milk    Interventions Interventions: Breast feeding basics reviewed;Skin to skin;Hand express;Position options;Ice;Education;CDC milk storage guidelines  Lactation Student discussed following infant feeding cues and how to determine when baby is satisfied. LC Student explained how to unlatch baby, burp and place baby skin to skin to avoid comfort nursing. Normal infant behaviors, stomach size and cluster feeding was explained. Student discussed importance of input and diaper output. The benefits and concentration of colostrum was explained as well as the new engorgement protocols. [facility] Student went over trying other positions to ensure baby has a deep  latch to avoid any nipple pain. Breast milk storage guidelines were given and information on how to follow up with outpatient if any further assistance is needed.  Discharge Discharge Education: Engorgement and breast care Pump: Hands Free (Wearable)  Consult Status Consult Status: Follow-up Follow-up type: Out-patient    Merland Holness 12/03/2023, 2:13 PM

## 2023-12-03 NOTE — Discharge Instructions (Signed)

## 2023-12-03 NOTE — TOC Progression Note (Signed)
 Transition of Care [facility]) - Progression Note    Patient Details  Name: Taylor Galvan MRN: [id number] Date of Birth: 1996/03/22  Transition of Care [facility]) CM/SW Contact  Garret Reddish, RN Phone Number: 12/03/2023, 11:56 AM  Clinical Narrative:    Chart reviewed.  I have meet with patient at bedside today.  Patient had the baby, FOB and her grandmother at bedside.  I have explained HIPAA to FOB and grandmother in the room.  I have asked FOB and grandmother to step out of the room.  Both persons where agreeable.    I have explained reason for consult to Mrs.  Melida Quitter.  I have informed Mrs. Clements that her urine drug screen resulted positive for Cannabinoid. Mrs. Revard does admit to using Cannabinoid during pregnancy.  Mrs. Cassarino explains that she has severe nausea during pregnancy and no prescribed medication would take the nausea away.  She informs me that Cannabinoid was the only thing that would take the nausea away.  I have informed Mrs. Carriveau that CPS report has been made per protocol.    Mrs. Firkus informs me that she is doing well today.  I have reviewed Post Postpartum signs with Mrs. Melida Quitter.  I have also given her Postpartum information sheet.  I have also discussed with Mrs. Schiefelbein Sudden Infant Death Syndrome.  Mrs. Torok verbalizes understanding.    Mrs. Dusek denies any Suicidal/ Homicidal thoughts.  She denies any Mental health hx.  Patient also denies any domestic violence.   Mrs. Zimny informs me that FOB Silverio Lay will be her support system.  Mrs. Hennings lives at home with her 84 year old son.    Mrs. Virginia has chosen [employer] for the [employer].  She will be breast feeding and has a breast pump.  She has a bassinet for the baby to sleep in, a car seat, diapers, and clothes for the baby.  She has transpiring.    She voice no other needs or concerns.    I have called CPS report to Marny Lowenstein will await return call back.     TOC will follow for discharge.          Expected Discharge Plan and Services                                               Social Determinants of Health (SDOH) Interventions SDOH Screenings   Food Insecurity: No Food Insecurity (12/01/2023)  Housing: Low Risk  (12/01/2023)  Transportation Needs: No Transportation Needs (12/01/2023)  Utilities: Not At Risk (12/01/2023)  Depression (PHQ2-9): Low Risk  (08/08/2022)  Financial Resource Strain: Low Risk  (05/24/2023)  Physical Activity: Inactive (05/24/2023)  Social Connections: Unknown (12/01/2023)  Stress: No Stress Concern Present (05/24/2023)  Tobacco Use: Medium Risk (12/01/2023)  Health Literacy: Adequate Health Literacy (05/24/2023)    Readmission Risk Interventions     No data to display

## 2023-12-04 ENCOUNTER — Encounter: Payer: Self-pay | Admitting: Certified Nurse Midwife

## 2023-12-25 ENCOUNTER — Telehealth: Payer: Self-pay

## 2023-12-25 NOTE — Telephone Encounter (Signed)
 Patient requesting refill of sertraline (ZOLOFT) 100 MG tablet . She has missed 3-4 days due to request not being responded to by her psychiatrist.

## 2024-01-01 DIAGNOSIS — F411 Generalized anxiety disorder: Secondary | ICD-10-CM | POA: Diagnosis not present

## 2024-01-01 DIAGNOSIS — F4322 Adjustment disorder with anxiety: Secondary | ICD-10-CM | POA: Diagnosis not present

## 2024-06-26 ENCOUNTER — Emergency Department
Admission: EM | Admit: 2024-06-26 | Discharge: 2024-06-26 | Disposition: A | Discharge status: Home / Self Care | Attending: Emergency Medicine | Admitting: Emergency Medicine

## 2024-06-26 ENCOUNTER — Encounter: Payer: Self-pay | Admitting: Emergency Medicine

## 2024-06-26 ENCOUNTER — Other Ambulatory Visit: Payer: Self-pay

## 2024-06-26 ENCOUNTER — Emergency Department

## 2024-06-26 DIAGNOSIS — N132 Hydronephrosis with renal and ureteral calculous obstruction: Secondary | ICD-10-CM | POA: Diagnosis not present

## 2024-06-26 DIAGNOSIS — N200 Calculus of kidney: Secondary | ICD-10-CM | POA: Diagnosis not present

## 2024-06-26 DIAGNOSIS — R110 Nausea: Secondary | ICD-10-CM | POA: Insufficient documentation

## 2024-06-26 DIAGNOSIS — N134 Hydroureter: Secondary | ICD-10-CM | POA: Diagnosis not present

## 2024-06-26 DIAGNOSIS — R1031 Right lower quadrant pain: Secondary | ICD-10-CM | POA: Diagnosis not present

## 2024-06-26 LAB — URINALYSIS, ROUTINE W REFLEX MICROSCOPIC
Bilirubin Urine: NEGATIVE
Glucose, UA: NEGATIVE mg/dL
Hgb urine dipstick: NEGATIVE
Ketones, ur: NEGATIVE mg/dL
Nitrite: NEGATIVE
Protein, ur: 30 mg/dL — AB
Specific Gravity, Urine: 1.028 (ref 1.005–1.030)
pH: 5 (ref 5.0–8.0)

## 2024-06-26 LAB — CBC
HCT: 45.1 % (ref 36.0–46.0)
Hemoglobin: 15.1 g/dL — ABNORMAL HIGH (ref 12.0–15.0)
MCH: 29.3 pg (ref 26.0–34.0)
MCHC: 33.5 g/dL (ref 30.0–36.0)
MCV: 87.6 fL (ref 80.0–100.0)
Platelets: 246 K/uL (ref 150–400)
RBC: 5.15 MIL/uL — ABNORMAL HIGH (ref 3.87–5.11)
RDW: 12.9 % (ref 11.5–15.5)
WBC: 12.1 K/uL — ABNORMAL HIGH (ref 4.0–10.5)
nRBC: 0 % (ref 0.0–0.2)

## 2024-06-26 LAB — COMPREHENSIVE METABOLIC PANEL WITH GFR
ALT: 19 U/L (ref 0–44)
AST: 14 U/L — ABNORMAL LOW (ref 15–41)
Albumin: 4.1 g/dL (ref 3.5–5.0)
Alkaline Phosphatase: 89 U/L (ref 38–126)
Anion gap: 9 (ref 5–15)
BUN: 16 mg/dL (ref 6–20)
CO2: 23 mmol/L (ref 22–32)
Calcium: 8.8 mg/dL — ABNORMAL LOW (ref 8.9–10.3)
Chloride: 106 mmol/L (ref 98–111)
Creatinine, Ser: 0.76 mg/dL (ref 0.44–1.00)
GFR, Estimated: >60 mL/min (ref >60)
Glucose, Bld: 102 mg/dL — ABNORMAL HIGH (ref 70–99)
Potassium: 3.7 mmol/L (ref 3.5–5.1)
Sodium: 138 mmol/L (ref 135–145)
Total Bilirubin: 0.8 mg/dL (ref 0.0–1.2)
Total Protein: 7.9 g/dL (ref 6.5–8.1)

## 2024-06-26 LAB — POC URINE PREG, ED: Preg Test, Ur: NEGATIVE

## 2024-06-26 LAB — LIPASE, BLOOD: Lipase: 20 U/L (ref 11–51)

## 2024-06-26 MED ORDER — SODIUM CHLORIDE 0.9 % IV SOLN
Freq: Once | INTRAVENOUS | Status: AC
  Administered 2024-06-26: 1000 mL via INTRAVENOUS

## 2024-06-26 MED ORDER — TAMSULOSIN HCL 0.4 MG PO CAPS: 0.4000 mg | ORAL_CAPSULE | Freq: Every day | ORAL | 0 refills | Status: AC

## 2024-06-26 MED ORDER — IOHEXOL 300 MG/ML  SOLN
100.0000 mL | Freq: Once | INTRAMUSCULAR | Status: AC | PRN
  Administered 2024-06-26: 100 mL via INTRAVENOUS

## 2024-06-26 MED ORDER — MORPHINE SULFATE (PF) 4 MG/ML IV SOLN
4.0000 mg | Freq: Once | INTRAVENOUS | Status: AC
  Administered 2024-06-26: 4 mg via INTRAVENOUS
  Filled 2024-06-26: qty 1

## 2024-06-26 MED ORDER — NAPROXEN 500 MG PO TABS: 500.0000 mg | ORAL_TABLET | Freq: Two times a day (BID) | ORAL | 2 refills | Status: AC

## 2024-06-26 MED ORDER — OXYCODONE-ACETAMINOPHEN 5-325 MG PO TABS: 1.0000 | ORAL_TABLET | Freq: Three times a day (TID) | ORAL | 0 refills | Status: AC | PRN

## 2024-06-26 MED ORDER — KETOROLAC TROMETHAMINE 30 MG/ML IJ SOLN
30.0000 mg | Freq: Once | INTRAMUSCULAR | Status: AC
  Administered 2024-06-26: 30 mg via INTRAVENOUS
  Filled 2024-06-26: qty 1

## 2024-06-26 MED ORDER — ONDANSETRON HCL 4 MG/2ML IJ SOLN
4.0000 mg | Freq: Once | INTRAMUSCULAR | Status: AC
  Administered 2024-06-26: 4 mg via INTRAVENOUS
  Filled 2024-06-26: qty 2

## 2024-06-26 MED ORDER — ONDANSETRON 4 MG PO TBDP: 4.0000 mg | ORAL_TABLET | Freq: Three times a day (TID) | ORAL | 0 refills | Status: AC | PRN

## 2024-06-26 MED ORDER — HYDROMORPHONE HCL 1 MG/ML IJ SOLN
1.0000 mg | Freq: Once | INTRAMUSCULAR | Status: AC
  Administered 2024-06-26: 1 mg via INTRAVENOUS
  Filled 2024-06-26: qty 1

## 2024-06-26 NOTE — ED Triage Notes (Signed)
 Pt states that her pain started this morning, reports pain across her lower back radiating into her lower abd, denies pain with urination, states hurts worse on the right side, states hx of kidney stones

## 2024-06-26 NOTE — ED Notes (Signed)
 See triage note  Presents with abd pain and back pain  States pain is worse on the right  Pain started this am

## 2024-06-26 NOTE — ED Provider Notes (Signed)
 [facility] Provider Note    Event Date/Time   First MD Initiated Contact with Patient 06/26/24 1508     (approximate)   History   Abdominal Pain and Back Pain   HPI  Taylor Galvan is a 28 y.o. female with a history of kidney stones who presents with complaints of right-sided flank/right lower quadrant abdominal pain which started this morning relatively gradually.  She denies dysuria.  No fevers.  Positive nausea no vomiting.     Physical Exam   Triage Vital Signs: ED Triage Vitals [06/26/24 1346]  Encounter Vitals Group     BP (!) 146/91     Girls Systolic BP Percentile      Girls Diastolic BP Percentile      Boys Systolic BP Percentile      Boys Diastolic BP Percentile      Pulse Rate 64     Resp 20     Temp 98.4 F (36.9 C)     Temp Source Oral     SpO2 100 %     Weight 99.8 kg (220 lb)     Height 1.753 m (5' 9)     Head Circumference      Peak Flow      Pain Score 8     Pain Loc      Pain Education      Exclude from Growth Chart     Most recent vital signs: Vitals:   06/26/24 1346 06/26/24 1817  BP: (!) 146/91 (!) 140/88  Pulse: 64 68  Resp: 20 18  Temp: 98.4 F (36.9 C) 98 F (36.7 C)  SpO2: 100% 99%     General: Awake, no distress.  CV:  Good peripheral perfusion.  Resp:  Normal effort.  Abd:  No distention.  No CVA tenderness, abdominal exam with mild tenderness in the right lower quadrant Other:     ED Results / Procedures / Treatments   Labs (all labs ordered are listed, but only abnormal results are displayed) Labs Reviewed  COMPREHENSIVE METABOLIC PANEL WITH GFR - Abnormal; Notable for the following components:      Result Value   Glucose, Bld 102 (*)    Calcium 8.8 (*)    AST 14 (*)    All other components within normal limits  CBC - Abnormal; Notable for the following components:   WBC 12.1 (*)    RBC 5.15 (*)    Hemoglobin 15.1 (*)    All other components within normal limits  URINALYSIS,  ROUTINE W REFLEX MICROSCOPIC - Abnormal; Notable for the following components:   Color, Urine YELLOW (*)    APPearance CLOUDY (*)    Protein, ur 30 (*)    Leukocytes,Ua MODERATE (*)    Bacteria, UA FEW (*)    All other components within normal limits  LIPASE, BLOOD  POC URINE PREG, ED     EKG     RADIOLOGY CT abdomen pelvis pending    PROCEDURES:  Critical Care performed:   Procedures   MEDICATIONS ORDERED IN ED: Medications  ketorolac  (TORADOL ) 30 MG/ML injection 30 mg (30 mg Intravenous Given 06/26/24 1611)  ondansetron  (ZOFRAN ) injection 4 mg (4 mg Intravenous Given 06/26/24 1611)  0.9 %  sodium chloride  infusion (0 mLs Intravenous Stopped 06/26/24 1727)  iohexol  (OMNIPAQUE ) 300 MG/ML solution 100 mL (100 mLs Intravenous Contrast Given 06/26/24 1613)  morphine  (PF) 4 MG/ML injection 4 mg (4 mg Intravenous Given 06/26/24 1725)  HYDROmorphone  (DILAUDID )  injection 1 mg (1 mg Intravenous Given 06/26/24 1828)  ondansetron  (ZOFRAN ) injection 4 mg (4 mg Intravenous Given 06/26/24 1917)     IMPRESSION / MDM / ASSESSMENT AND PLAN / ED COURSE  I reviewed the triage vital signs and the nursing notes. Patient's presentation is most consistent with acute presentation with potential threat to life or bodily function.  Patient presents with abdominal pain as detailed above, differential includes ureterolithiasis, UTI, pyelonephritis, appendicitis.  Will treat with IV Toradol , IV Zofran , IV fluids, labs reviewed and overall reassuring, pending CT abdomen pelvis.  CT scan demonstrates 6 mm right UVJ stone, will give additional pain medication, IV morphine .  Urinalysis not consistent with infection, lab work is overall reassuring, mild elevation white blood cell count is nonspecific.  Additional pain medication given, IV Dilaudid   Patient is now feeling much better, she would like to be discharged, she knows to return if any fevers chills dysuria worsening pain      FINAL CLINICAL  IMPRESSION(S) / ED DIAGNOSES   Final diagnoses:  Kidney stone     Rx / DC Orders   ED Discharge Orders          Ordered    oxyCODONE -acetaminophen  (PERCOCET) 5-325 MG tablet  Every 8 hours PRN        06/26/24 1945    naproxen  (NAPROSYN ) 500 MG tablet  2 times daily with meals        06/26/24 1945    tamsulosin  (FLOMAX ) 0.4 MG CAPS capsule  Daily        06/26/24 1945    ondansetron  (ZOFRAN -ODT) 4 MG disintegrating tablet  Every 8 hours PRN        06/26/24 1945             Note:  This document was prepared using Dragon voice recognition software and may include unintentional dictation errors.   Arlander Charleston, MD 06/26/24 [phone]

## 2024-06-26 NOTE — ED Notes (Signed)
 Nausea meds given.

## 2024-09-19 ENCOUNTER — Telehealth

## 2024-09-19 DIAGNOSIS — R6889 Other general symptoms and signs: Secondary | ICD-10-CM

## 2024-09-20 MED ORDER — BENZONATATE 100 MG PO CAPS: 100.0000 mg | ORAL_CAPSULE | Freq: Three times a day (TID) | ORAL | 0 refills | Status: AC | PRN

## 2024-09-20 MED ORDER — OSELTAMIVIR PHOSPHATE 75 MG PO CAPS: 75.0000 mg | ORAL_CAPSULE | Freq: Two times a day (BID) | ORAL | 0 refills | Status: AC

## 2024-09-20 NOTE — Progress Notes (Signed)
 E visit for Flu like symptoms   We are sorry that you are not feeling well.  Here is how we plan to help! Based on what you have shared with me it looks like you may have a respiratory virus that may be influenza.  Influenza or the flu is  an infection caused by a respiratory virus. The flu virus is highly contagious and persons who did not receive their yearly flu vaccination may catch the flu from close contact.  We have anti-viral medications to treat the viruses that cause this infection. They are not a cure and only shorten the course of the infection. These prescriptions are most effective when they are given within the first 2 days of flu symptoms. Antiviral medications are indicated if you have a high risk of complications from the flu. You should  also consider an antiviral medication if you are in close contact with someone who is at risk. These medications can help patients avoid complications from the flu but have side effects that you should know.   Possible side effects from Tamiflu  or oseltamivir  include nausea, vomiting, diarrhea, dizziness, headaches, eye redness, sleep problems or other respiratory symptoms. You should not take Tamiflu  if you have an allergy to oseltamivir  or any to the ingredients in Tamiflu .  Based upon your symptoms and potential risk factors I have prescribed Oseltamivir  (Tamiflu ).  It has been sent to your designated pharmacy.  You will take one 75 mg capsule orally twice a day for the next 5 days.   For nasal congestion, you may use an oral decongestant such as Mucinex D or if you have glaucoma or high blood pressure use plain Mucinex.  Saline nasal spray or nasal drops can help and can safely be used as often as needed for congestion.  If you have a sore or scratchy throat, use a saltwater gargle-  to  teaspoon of salt dissolved in a 4-ounce to 8-ounce glass of warm water.  Gargle the solution for approximately 15-30 seconds and then spit.  It is  important not to swallow the solution.  You can also use throat lozenges/cough drops and Chloraseptic spray to help with throat pain or discomfort.  Warm or cold liquids can also be helpful in relieving throat pain.  For headache, pain or general discomfort, you can use Ibuprofen  or Tylenol  as directed.   Some authorities believe that zinc sprays or the use of Echinacea may shorten the course of your symptoms.  I have prescribed the following medications to help lessen symptoms: I have prescribed Tessalon  Perles 100 mg. You may take 1-2 capsules every 8 hours as needed for cough  You are to isolate at home until you have been fever-free for at least 24 hours without a fever-reducing medication, and symptoms have been steadily improving for 24 hours.  If you must be around other household members who do not have symptoms, you need to make sure that both you and the family members are masking consistently with a high-quality mask.  If you note any worsening of symptoms despite treatment, please seek an in-person evaluation ASAP. If you note any significant shortness of breath or any chest pain, please seek ED evaluation. Please do not delay care!  ANYONE WHO HAS FLU SYMPTOMS SHOULD: Stay home. The flu is highly contagious and going out or to work exposes others! Be sure to drink plenty of fluids. Water is fine as well as fruit juices, sodas and electrolyte beverages. You may want to stay  away from caffeine or alcohol. If you are nauseated, try taking small sips of liquids. How do you know if you are getting enough fluid? Your urine should be a pale yellow or almost colorless. Get rest. Taking a steamy shower or using a humidifier may help nasal congestion and ease sore throat pain. Using a saline nasal spray works much the same way. Cough drops, hard candies and sore throat lozenges may ease your cough. Line up a caregiver. Have someone check on you regularly.  GET HELP RIGHT AWAY IF: You cannot  keep down liquids or your medications. You become short of breath Your fell like you are going to pass out or loose consciousness. Your symptoms persist after you have completed your treatment plan  MAKE SURE YOU  Understand these instructions. Will watch your condition. Will get help right away if you are not doing well or get worse.  Your e-visit answers were reviewed by a board certified advanced clinical practitioner to complete your personal care plan.  Depending on the condition, your plan could have included both over the counter or prescription medications.  If there is a problem please reply  once you have received a response from your provider.  Your safety is important to us .  If you have drug allergies check your prescription carefully.    You can use MyChart to ask questions about todays visit, request a non-urgent call back, or ask for a work or school excuse for 24 hours related to this e-Visit. If it has been greater than 24 hours you will need to follow up with your provider, or enter a new e-Visit to address those concerns.  You will get an e-mail in the next two days asking about your experience.  I hope that your e-visit has been valuable and will speed your recovery. Thank you for using e-visits.   I have spent 5 minutes in review of e-visit questionnaire, review and updating patient chart, medical decision making and response to patient.   Roosvelt Mater, PA-C
